# Patient Record
Sex: Female | Born: 1935 | Race: White | Hispanic: No | Smoking: Never smoker
Health system: Southern US, Community
[De-identification: ages and names within clinical notes are randomized; demographics above are authoritative.]

## PROBLEM LIST (undated history)

## (undated) DIAGNOSIS — N289 Disorder of kidney and ureter, unspecified: Secondary | ICD-10-CM

## (undated) DIAGNOSIS — T7840XA Allergy, unspecified, initial encounter: Secondary | ICD-10-CM

## (undated) DIAGNOSIS — I10 Essential (primary) hypertension: Secondary | ICD-10-CM

## (undated) DIAGNOSIS — M1990 Unspecified osteoarthritis, unspecified site: Secondary | ICD-10-CM

## (undated) DIAGNOSIS — D219 Benign neoplasm of connective and other soft tissue, unspecified: Secondary | ICD-10-CM

## (undated) HISTORY — DX: Benign neoplasm of connective and other soft tissue, unspecified: D21.9

## (undated) HISTORY — DX: Allergy, unspecified, initial encounter: T78.40XA

## (undated) HISTORY — DX: Unspecified osteoarthritis, unspecified site: M19.90

## (undated) HISTORY — DX: Disorder of kidney and ureter, unspecified: N28.9

## (undated) HISTORY — DX: Essential (primary) hypertension: I10

## (undated) HISTORY — PX: ABDOMINAL HYSTERECTOMY: SHX81

---

## 1966-02-21 HISTORY — PX: APPENDECTOMY: SHX54

## 2004-01-30 ENCOUNTER — Ambulatory Visit: Payer: Self-pay | Admitting: Internal Medicine

## 2004-03-16 ENCOUNTER — Ambulatory Visit: Payer: Self-pay | Admitting: Nephrology

## 2005-02-11 ENCOUNTER — Emergency Department: Payer: Self-pay | Admitting: Internal Medicine

## 2005-02-11 ENCOUNTER — Other Ambulatory Visit: Payer: Self-pay

## 2005-03-29 ENCOUNTER — Ambulatory Visit: Payer: Self-pay | Admitting: Internal Medicine

## 2006-04-25 ENCOUNTER — Ambulatory Visit: Payer: Self-pay | Admitting: Internal Medicine

## 2007-06-19 ENCOUNTER — Ambulatory Visit: Payer: Self-pay | Admitting: Internal Medicine

## 2008-02-25 ENCOUNTER — Ambulatory Visit: Payer: Self-pay | Admitting: Rheumatology

## 2008-06-10 ENCOUNTER — Ambulatory Visit: Payer: Self-pay | Admitting: Internal Medicine

## 2008-10-16 ENCOUNTER — Other Ambulatory Visit: Payer: Self-pay | Admitting: Internal Medicine

## 2008-12-03 ENCOUNTER — Other Ambulatory Visit: Payer: Self-pay | Admitting: Internal Medicine

## 2008-12-18 ENCOUNTER — Other Ambulatory Visit: Payer: Self-pay | Admitting: Internal Medicine

## 2009-02-21 HISTORY — PX: OTHER SURGICAL HISTORY: SHX169

## 2009-06-02 ENCOUNTER — Ambulatory Visit: Payer: Self-pay | Admitting: Ophthalmology

## 2009-08-05 ENCOUNTER — Ambulatory Visit: Payer: Self-pay | Admitting: Ophthalmology

## 2009-12-16 ENCOUNTER — Ambulatory Visit: Payer: Self-pay | Admitting: Internal Medicine

## 2010-01-18 ENCOUNTER — Ambulatory Visit: Payer: Self-pay | Admitting: Internal Medicine

## 2010-11-02 ENCOUNTER — Ambulatory Visit: Payer: Self-pay | Admitting: Internal Medicine

## 2011-08-05 ENCOUNTER — Ambulatory Visit: Payer: Self-pay | Admitting: Gastroenterology

## 2011-08-12 ENCOUNTER — Ambulatory Visit: Payer: Self-pay | Admitting: Gastroenterology

## 2011-10-31 ENCOUNTER — Emergency Department: Payer: Self-pay | Admitting: Emergency Medicine

## 2011-10-31 LAB — COMPREHENSIVE METABOLIC PANEL
Anion Gap: 9 (ref 7–16)
BUN: 31 mg/dL — ABNORMAL HIGH (ref 7–18)
Calcium, Total: 8.5 mg/dL (ref 8.5–10.1)
Chloride: 107 mmol/L (ref 98–107)
Co2: 23 mmol/L (ref 21–32)
Creatinine: 2.05 mg/dL — ABNORMAL HIGH (ref 0.60–1.30)
EGFR (African American): 24 — ABNORMAL LOW
EGFR (Non-African Amer.): 21 — ABNORMAL LOW
Osmolality: 286 (ref 275–301)
Potassium: 4.7 mmol/L (ref 3.5–5.1)
Sodium: 139 mmol/L (ref 136–145)

## 2011-10-31 LAB — CBC WITH DIFFERENTIAL/PLATELET
Basophil #: 0.1 10*3/uL (ref 0.0–0.1)
Eosinophil #: 0.1 10*3/uL (ref 0.0–0.7)
Lymphocyte #: 1.5 10*3/uL (ref 1.0–3.6)
MCH: 36.7 pg — ABNORMAL HIGH (ref 26.0–34.0)
MCHC: 34 g/dL (ref 32.0–36.0)
MCV: 108 fL — ABNORMAL HIGH (ref 80–100)
Monocyte #: 1.7 x10 3/mm — ABNORMAL HIGH (ref 0.2–0.9)
Monocyte %: 13.3 %
Neutrophil #: 9.5 10*3/uL — ABNORMAL HIGH (ref 1.4–6.5)
Platelet: 101 10*3/uL — ABNORMAL LOW (ref 150–440)
RBC: 2.98 10*6/uL — ABNORMAL LOW (ref 3.80–5.20)
RDW: 16 % — ABNORMAL HIGH (ref 11.5–14.5)
WBC: 12.9 10*3/uL — ABNORMAL HIGH (ref 3.6–11.0)

## 2011-10-31 LAB — URINALYSIS, COMPLETE
Blood: NEGATIVE
Nitrite: NEGATIVE
Ph: 5 (ref 4.5–8.0)
Protein: NEGATIVE

## 2011-10-31 LAB — LIPASE, BLOOD: Lipase: 76 U/L (ref 73–393)

## 2011-11-02 LAB — URINE CULTURE

## 2011-11-29 LAB — BASIC METABOLIC PANEL
BUN: 21 mg/dL (ref 4–21)
Creatinine: 1.6 mg/dL — AB (ref 0.5–1.1)
Glucose: 96 mg/dL
Potassium: 4.1 mmol/L (ref 3.4–5.3)
Sodium: 139 mmol/L (ref 137–147)

## 2011-11-29 LAB — HEPATIC FUNCTION PANEL
Alkaline Phosphatase: 98 U/L (ref 25–125)
Bilirubin, Total: 2.9 mg/dL

## 2011-11-29 LAB — PROTIME-INR: Protime: 14.4 seconds — AB (ref 10.0–13.8)

## 2011-11-29 LAB — CBC AND DIFFERENTIAL
HCT: 33 % — AB (ref 36–46)
WBC: 5.2 10^3/mL

## 2012-01-14 ENCOUNTER — Telehealth: Payer: Self-pay | Admitting: Internal Medicine

## 2012-01-14 MED ORDER — METOPROLOL SUCCINATE ER 50 MG PO TB24: 50.0000 mg | ORAL_TABLET | Freq: Every day | ORAL | Status: AC

## 2012-01-14 NOTE — Telephone Encounter (Signed)
rx sent in to [facility] for metoprolol #90 with one refil

## 2012-01-27 ENCOUNTER — Ambulatory Visit: Payer: Self-pay | Admitting: Gastroenterology

## 2012-02-07 ENCOUNTER — Telehealth: Payer: Self-pay | Admitting: Internal Medicine

## 2012-02-07 MED ORDER — FUROSEMIDE 20 MG PO TABS: ORAL_TABLET | ORAL | Status: AC

## 2012-02-07 NOTE — Telephone Encounter (Signed)
Sent in to pharmacy.  

## 2012-02-07 NOTE — Telephone Encounter (Signed)
Drug Name- Furosemide 20 mg tab  Directions- take one tablet three times per week  Quantity- 30

## 2012-03-21 ENCOUNTER — Encounter: Payer: Self-pay | Admitting: *Deleted

## 2012-03-23 ENCOUNTER — Encounter: Payer: Self-pay | Admitting: Internal Medicine

## 2012-03-23 ENCOUNTER — Ambulatory Visit (INDEPENDENT_AMBULATORY_CARE_PROVIDER_SITE_OTHER): Payer: Medicare Other | Admitting: Internal Medicine

## 2012-03-23 VITALS — BP 140/70 | HR 87 | Temp 97.9°F | Ht 64.5 in | Wt 153.8 lb

## 2012-03-23 DIAGNOSIS — N289 Disorder of kidney and ureter, unspecified: Secondary | ICD-10-CM

## 2012-03-23 DIAGNOSIS — E7800 Pure hypercholesterolemia, unspecified: Secondary | ICD-10-CM

## 2012-03-23 DIAGNOSIS — R739 Hyperglycemia, unspecified: Secondary | ICD-10-CM

## 2012-03-23 DIAGNOSIS — R109 Unspecified abdominal pain: Secondary | ICD-10-CM

## 2012-03-23 DIAGNOSIS — M109 Gout, unspecified: Secondary | ICD-10-CM

## 2012-03-23 DIAGNOSIS — R634 Abnormal weight loss: Secondary | ICD-10-CM

## 2012-03-23 DIAGNOSIS — R110 Nausea: Secondary | ICD-10-CM

## 2012-03-23 DIAGNOSIS — I10 Essential (primary) hypertension: Secondary | ICD-10-CM

## 2012-03-23 DIAGNOSIS — R7309 Other abnormal glucose: Secondary | ICD-10-CM

## 2012-03-23 DIAGNOSIS — R002 Palpitations: Secondary | ICD-10-CM

## 2012-03-23 DIAGNOSIS — M1990 Unspecified osteoarthritis, unspecified site: Secondary | ICD-10-CM

## 2012-03-23 LAB — BASIC METABOLIC PANEL
BUN: 26 mg/dL — ABNORMAL HIGH (ref 6–23)
Calcium: 8.9 mg/dL (ref 8.4–10.5)
Chloride: 104 mEq/L (ref 96–112)
Creatinine, Ser: 2.3 mg/dL — ABNORMAL HIGH (ref 0.4–1.2)

## 2012-03-23 LAB — LIPID PANEL
Cholesterol: 115 mg/dL (ref 0–200)
HDL: 36.6 mg/dL — ABNORMAL LOW (ref >39.00)
LDL Cholesterol: 59 mg/dL (ref 0–99)
Total CHOL/HDL Ratio: 3
Triglycerides: 98 mg/dL (ref 0.0–149.0)

## 2012-03-23 LAB — CBC WITH DIFFERENTIAL/PLATELET
Basophils Absolute: 0.1 10*3/uL (ref 0.0–0.1)
Eosinophils Relative: 6.6 % — ABNORMAL HIGH (ref 0.0–5.0)
HCT: 32.1 % — ABNORMAL LOW (ref 36.0–46.0)
Hemoglobin: 10.6 g/dL — ABNORMAL LOW (ref 12.0–15.0)
Lymphocytes Relative: 16.8 % (ref 12.0–46.0)
Monocytes Relative: 16.5 % — ABNORMAL HIGH (ref 3.0–12.0)
Platelets: 159 10*3/uL (ref 150.0–400.0)
RDW: 15.9 % — ABNORMAL HIGH (ref 11.5–14.6)
WBC: 6.7 10*3/uL (ref 4.5–10.5)

## 2012-03-23 LAB — HEPATIC FUNCTION PANEL
ALT: 30 U/L (ref 0–35)
Bilirubin, Direct: 1.3 mg/dL — ABNORMAL HIGH (ref 0.0–0.3)
Total Bilirubin: 4.3 mg/dL — ABNORMAL HIGH (ref 0.3–1.2)
Total Protein: 6.7 g/dL (ref 6.0–8.3)

## 2012-03-23 LAB — LIPASE: Lipase: 25 U/L (ref 11.0–59.0)

## 2012-03-23 LAB — AMYLASE: Amylase: 29 U/L (ref 27–131)

## 2012-03-24 ENCOUNTER — Encounter: Payer: Self-pay | Admitting: Internal Medicine

## 2012-03-25 ENCOUNTER — Encounter: Payer: Self-pay | Admitting: Internal Medicine

## 2012-03-25 DIAGNOSIS — N289 Disorder of kidney and ureter, unspecified: Secondary | ICD-10-CM | POA: Insufficient documentation

## 2012-03-25 DIAGNOSIS — M109 Gout, unspecified: Secondary | ICD-10-CM | POA: Insufficient documentation

## 2012-03-25 DIAGNOSIS — R002 Palpitations: Secondary | ICD-10-CM | POA: Insufficient documentation

## 2012-03-25 DIAGNOSIS — R739 Hyperglycemia, unspecified: Secondary | ICD-10-CM | POA: Insufficient documentation

## 2012-03-25 DIAGNOSIS — M1990 Unspecified osteoarthritis, unspecified site: Secondary | ICD-10-CM | POA: Insufficient documentation

## 2012-03-25 DIAGNOSIS — I10 Essential (primary) hypertension: Secondary | ICD-10-CM | POA: Insufficient documentation

## 2012-03-25 NOTE — Assessment & Plan Note (Signed)
Check metabolic panel and a1c.   

## 2012-03-25 NOTE — Assessment & Plan Note (Signed)
Saw Dr Rosita Kea.  S/p injection.  Ortho recommended a partial knee replacement.  She desired not to pursue.  Follow.

## 2012-03-25 NOTE — Assessment & Plan Note (Signed)
Blood pressure under reasonable control.  Same medication.  Check metabolic panel.      

## 2012-03-25 NOTE — Assessment & Plan Note (Signed)
Not an issue for her now.  Follow.  

## 2012-03-25 NOTE — Assessment & Plan Note (Signed)
Off allopurinol.  Stable.

## 2012-03-25 NOTE — Assessment & Plan Note (Signed)
Will recheck metabolic panel to confirm renal function stable.  Check electrolytes.

## 2012-03-25 NOTE — Progress Notes (Signed)
Subjective:    Patient ID: Lindsey Trevino, female    DOB: 17-Dec-1921, 79 y.o.   MRN: [id number]  HPI 79 year old female with past history of irregular heartbeat (previously worked up at [facility]), hypertension and renal insufficiency.  She comes in today accompanied by her granddaughter.  History obtained from both of them.  She states that in 9/13 she started having hallucinations and did not feel well.  Went to ER.  Diagnosed with UTI. Treated.  Has had no further hallucinations.  She has noticed recently - increased nausea.  Feels sick on her stomach.  Some intermittent emesis.  Mostly phlegm.  She reports decreased appetite.  Has lost a significant amount of weight.  Some constipation.  Last bowel movement - yesterday (small).  No blood.  No abdominal pain.  Had an abdominal ultrasound - ordered by GI.  Do not have results.  Saw GI.  Symptoms have worsened.  States she is trying to force herself to eat.  Yesterday, drank pineapple juice and developed stomach pain.  Emesis x 3.  No  Diarrhea.     Past Medical History  Diagnosis Date  . Hypertension   . Renal insufficiency   . Fibroid tumor     history  . Arthritis   . Allergy     Current Outpatient Prescriptions on File Prior to Visit  Medication Sig Dispense Refill  . furosemide (LASIX) 20 MG tablet Take one tablet three times a week  30 tablet  0  . metoprolol succinate (TOPROL-XL) 50 MG 24 hr tablet Take 1 tablet (50 mg total) by mouth daily. Take with or immediately following a meal.  90 tablet  1  . acetaminophen (TYLENOL) 650 MG CR tablet Take 650 mg by mouth every 8 (eight) hours as needed.      Marland Kitchen allopurinol (ZYLOPRIM) 100 MG tablet Take 100 mg by mouth daily.      . Multiple Vitamins-Minerals (RA VISION-VITE PRESERVE PO) Take 2 capsules by mouth daily.      . potassium chloride (K-DUR,KLOR-CON) 10 MEQ tablet Take 10 mEq by mouth daily. Take with Lasix      . triamcinolone cream (KENALOG) 0.1 % Apply 1 application topically 2 (two)  times daily.        Review of Systems Patient denies any headache, lightheadedness or dizziness.  No sinus or allergy symptoms.  No chest pain, tightness or palpitations.  No increased sob.  Increased fatigue.  Increased nausea and decreased appetite.  Some occasional emesis.  Mostly phlegm.   See above.  No abdominal pain.  Dry mouth.  Some constipation.  No blood.  Forcing herself to eat.   No urine change.  Weight loss.        Objective:   Physical Exam Filed Vitals:   03/23/12 0933  BP: 140/70  Pulse: 87  Temp: 97.9 F (36.6 C)   Blood pressure recheck:  140/72, pulse 50  79 year old female in no acute distress.   HEENT:  Nares- clear.  Oropharynx - without lesions. NECK:  Supple.  Nontender.  No audible bruit.  HEART:  Appears to be regular. LUNGS:  No crackles or wheezing audible.  Respirations even and unlabored.  RADIAL PULSE:  Equal bilaterally.  BREASTS:  No nipple discharge.  Could not appreciate any distinct nodules or axillary adenopathy. ABDOMEN:  Soft, nontender.  Bowel sounds present and normal.  No audible abdominal bruit.  RECTAL:  Heme negative.  No impaction.   EXTREMITIES:  No  increased edema present.  DP pulses palpable and equal bilaterally.           Assessment & Plan:  WEIGHT LOSS/NAUSEA/DECREASED APPETITE.  Will check cbc, liver panel, metabolic panel, amylase and lipase.  Obtain abdominal ultrasound results.  Stay hydrated.  Bland foods.  Further w/up pending review of the above.  May need CT (abdomen/pelvic).    HEALTH MAINTENANCE.  S/p hysterectomy.  See above.  Will need to keep on schedule with mammograms.

## 2012-03-26 ENCOUNTER — Telehealth: Payer: Self-pay | Admitting: Internal Medicine

## 2012-03-26 ENCOUNTER — Other Ambulatory Visit: Payer: Self-pay | Admitting: Internal Medicine

## 2012-03-26 DIAGNOSIS — R634 Abnormal weight loss: Secondary | ICD-10-CM

## 2012-03-26 DIAGNOSIS — R110 Nausea: Secondary | ICD-10-CM

## 2012-03-26 NOTE — Telephone Encounter (Signed)
Spoke with GI (Christianne) regarding her labs and further w/up.  Last bili they have 3.  Plan is to obtain another abdominal US - to reevaluate gall bladder as an etiology.  Also concern regarding primary biliary cirrhosis.  She declines bx.

## 2012-03-26 NOTE — Progress Notes (Signed)
Opened in error

## 2012-03-27 ENCOUNTER — Ambulatory Visit: Payer: Self-pay | Admitting: Internal Medicine

## 2012-03-28 ENCOUNTER — Telehealth: Payer: Self-pay | Admitting: *Deleted

## 2012-03-28 NOTE — Telephone Encounter (Signed)
Called GI.  They are going to work her in tomorrow.

## 2012-03-28 NOTE — Telephone Encounter (Signed)
Patient called back to tell you that when she called to get an appointment with Christiana Pellant they told her that they can not see her tomorrow but they can Friday morning. Patient wanted to know if it was all right to wait until then?

## 2012-04-02 ENCOUNTER — Inpatient Hospital Stay: Payer: Self-pay | Admitting: Internal Medicine

## 2012-04-02 LAB — URINALYSIS, COMPLETE
Bilirubin,UR: NEGATIVE
Hyaline Cast: 9
Ketone: NEGATIVE
Nitrite: NEGATIVE
Specific Gravity: 1.025 (ref 1.003–1.030)
WBC UR: 213 /HPF (ref 0–5)

## 2012-04-02 LAB — COMPREHENSIVE METABOLIC PANEL
Alkaline Phosphatase: 116 U/L (ref 50–136)
BUN: 33 mg/dL — ABNORMAL HIGH (ref 7–18)
Bilirubin,Total: 4.2 mg/dL — ABNORMAL HIGH (ref 0.2–1.0)
Calcium, Total: 9.6 mg/dL (ref 8.5–10.1)
Chloride: 109 mmol/L — ABNORMAL HIGH (ref 98–107)
Co2: 21 mmol/L (ref 21–32)
EGFR (Non-African Amer.): 15 — ABNORMAL LOW
Osmolality: 286 (ref 275–301)
SGOT(AST): 112 U/L — ABNORMAL HIGH (ref 15–37)
Sodium: 140 mmol/L (ref 136–145)
Total Protein: 7.6 g/dL (ref 6.4–8.2)

## 2012-04-02 LAB — MAGNESIUM: Magnesium: 2.2 mg/dL

## 2012-04-02 LAB — CBC
MCHC: 34 g/dL (ref 32.0–36.0)
RBC: 3.28 10*6/uL — ABNORMAL LOW (ref 3.80–5.20)
WBC: 7.8 10*3/uL (ref 3.6–11.0)

## 2012-04-03 ENCOUNTER — Telehealth: Payer: Self-pay | Admitting: Internal Medicine

## 2012-04-03 LAB — BASIC METABOLIC PANEL
Anion Gap: 10 (ref 7–16)
Anion Gap: 9 (ref 7–16)
BUN: 33 mg/dL — ABNORMAL HIGH (ref 7–18)
BUN: 32 mg/dL — ABNORMAL HIGH (ref 7–18)
Calcium, Total: 9.4 mg/dL (ref 8.5–10.1)
Co2: 23 mmol/L (ref 21–32)
Co2: 24 mmol/L (ref 21–32)
Creatinine: 3.07 mg/dL — ABNORMAL HIGH (ref 0.60–1.30)
Creatinine: 2.92 mg/dL — ABNORMAL HIGH (ref 0.60–1.30)
EGFR (African American): 15 — ABNORMAL LOW
EGFR (African American): 16 — ABNORMAL LOW
Potassium: 4.1 mmol/L (ref 3.5–5.1)

## 2012-04-03 LAB — AMMONIA: Ammonia, Plasma: 76 mcmol/L — ABNORMAL HIGH (ref 11–32)

## 2012-04-03 NOTE — Telephone Encounter (Signed)
Nurse was calling to let you know that she was put in the hospital at [facility].

## 2012-04-04 LAB — CBC WITH DIFFERENTIAL/PLATELET
Basophil #: 0.1 10*3/uL (ref 0.0–0.1)
Basophil %: 1.1 %
Eosinophil #: 0.6 10*3/uL (ref 0.0–0.7)
HCT: 29.6 % — ABNORMAL LOW (ref 35.0–47.0)
HGB: 9.9 g/dL — ABNORMAL LOW (ref 12.0–16.0)
Lymphocyte #: 1.6 10*3/uL (ref 1.0–3.6)
MCV: 104 fL — ABNORMAL HIGH (ref 80–100)
Monocyte %: 16.7 %
Neutrophil %: 50.9 %
Platelet: 117 10*3/uL — ABNORMAL LOW (ref 150–440)
RBC: 2.84 10*6/uL — ABNORMAL LOW (ref 3.80–5.20)
WBC: 7.2 10*3/uL (ref 3.6–11.0)

## 2012-04-04 LAB — BASIC METABOLIC PANEL
Anion Gap: 9 (ref 7–16)
BUN: 33 mg/dL — ABNORMAL HIGH (ref 7–18)
Calcium, Total: 8.8 mg/dL (ref 8.5–10.1)
Co2: 23 mmol/L (ref 21–32)
Creatinine: 2.92 mg/dL — ABNORMAL HIGH (ref 0.60–1.30)
EGFR (African American): 16 — ABNORMAL LOW
EGFR (Non-African Amer.): 14 — ABNORMAL LOW
Osmolality: 297 (ref 275–301)
Potassium: 3.8 mmol/L (ref 3.5–5.1)

## 2012-04-04 LAB — URINE CULTURE

## 2012-04-05 LAB — SODIUM: Sodium: 144 mmol/L (ref 136–145)

## 2012-04-05 LAB — CBC WITH DIFFERENTIAL/PLATELET
Basophil #: 0.1 x10 3/mm 3 (ref 0.0–0.1)
Basophil %: 1 %
Eosinophil #: 0.6 x10 3/mm 3 (ref 0.0–0.7)
Eosinophil %: 8.8 %
HCT: 28.2 % — ABNORMAL LOW (ref 35.0–47.0)
HGB: 9.5 g/dL — ABNORMAL LOW (ref 12.0–16.0)
Lymphocyte %: 25.7 %
Lymphs Abs: 1.7 x10 3/mm 3 (ref 1.0–3.6)
MCH: 35 pg — ABNORMAL HIGH (ref 26.0–34.0)
MCHC: 33.6 g/dL (ref 32.0–36.0)
MCV: 104 fL — ABNORMAL HIGH (ref 80–100)
Monocyte #: 1.2 x10 3/mm — ABNORMAL HIGH (ref 0.2–0.9)
Monocyte %: 18.3 %
Neutrophil #: 3 x10 3/mm 3 (ref 1.4–6.5)
Neutrophil %: 46.2 %
Platelet: 106 x10 3/mm 3 — ABNORMAL LOW (ref 150–440)
RBC: 2.72 X10 6/mm 3 — ABNORMAL LOW (ref 3.80–5.20)
RDW: 15.4 % — ABNORMAL HIGH (ref 11.5–14.5)
WBC: 6.4 x10 3/mm 3 (ref 3.6–11.0)

## 2012-04-05 LAB — COMPREHENSIVE METABOLIC PANEL
Alkaline Phosphatase: 90 U/L (ref 50–136)
Anion Gap: 9 (ref 7–16)
BUN: 32 mg/dL — ABNORMAL HIGH (ref 7–18)
Bilirubin,Total: 3 mg/dL — ABNORMAL HIGH (ref 0.2–1.0)
Calcium, Total: 9 mg/dL (ref 8.5–10.1)
Chloride: 116 mmol/L — ABNORMAL HIGH (ref 98–107)
Co2: 23 mmol/L (ref 21–32)
EGFR (African American): 17 — ABNORMAL LOW
EGFR (Non-African Amer.): 15 — ABNORMAL LOW
Glucose: 89 mg/dL (ref 65–99)
Osmolality: 301 (ref 275–301)
Potassium: 3.9 mmol/L (ref 3.5–5.1)
SGOT(AST): 73 U/L — ABNORMAL HIGH (ref 15–37)
SGPT (ALT): 28 U/L (ref 12–78)
Sodium: 148 mmol/L — ABNORMAL HIGH (ref 136–145)

## 2012-04-11 ENCOUNTER — Telehealth: Payer: Self-pay | Admitting: Internal Medicine

## 2012-04-11 NOTE — Telephone Encounter (Signed)
Pt discharged from [facility] last Thursday and [facility] opened her to services over the weekend (Saturday 02/15) due to our office being closed due to weather.  [facility] needs to be sure that Dr. Lorin Picket will sign orders for them regarding nursing, PT and an aide.   May contact Deborah at [facility] with any questions.  They just need a verbal okay.

## 2012-04-11 NOTE — Telephone Encounter (Signed)
I found the forms that were faxed last week. Called [facility], they said they did not receive anything. Gave verbal.

## 2012-04-11 NOTE — Telephone Encounter (Signed)
Ok.  I have already signed something for them.  It should have been faxed.

## 2012-04-13 ENCOUNTER — Ambulatory Visit (INDEPENDENT_AMBULATORY_CARE_PROVIDER_SITE_OTHER): Payer: Medicare Other | Admitting: Internal Medicine

## 2012-04-13 ENCOUNTER — Encounter: Payer: Self-pay | Admitting: Internal Medicine

## 2012-04-13 VITALS — BP 130/58 | HR 64 | Temp 97.8°F | Ht 64.5 in | Wt 208.5 lb

## 2012-04-13 DIAGNOSIS — R112 Nausea with vomiting, unspecified: Secondary | ICD-10-CM

## 2012-04-13 DIAGNOSIS — R002 Palpitations: Secondary | ICD-10-CM

## 2012-04-13 DIAGNOSIS — K7460 Unspecified cirrhosis of liver: Secondary | ICD-10-CM

## 2012-04-13 DIAGNOSIS — I10 Essential (primary) hypertension: Secondary | ICD-10-CM

## 2012-04-13 DIAGNOSIS — N289 Disorder of kidney and ureter, unspecified: Secondary | ICD-10-CM

## 2012-04-15 ENCOUNTER — Encounter: Payer: Self-pay | Admitting: Internal Medicine

## 2012-04-15 DIAGNOSIS — R112 Nausea with vomiting, unspecified: Secondary | ICD-10-CM | POA: Insufficient documentation

## 2012-04-15 DIAGNOSIS — K7460 Unspecified cirrhosis of liver: Secondary | ICD-10-CM | POA: Insufficient documentation

## 2012-04-15 NOTE — Assessment & Plan Note (Signed)
See above.  Has zofran.  Probably multifactorial.  Try to encourage increased po intake.

## 2012-04-15 NOTE — Assessment & Plan Note (Signed)
Not an issue for her now.  Follow.  

## 2012-04-15 NOTE — Assessment & Plan Note (Signed)
Will need to discuss with nephrology regarding diuretic therapy - given her renal insufficiency and poor po intake.  Follow.

## 2012-04-15 NOTE — Assessment & Plan Note (Signed)
Blood pressure under reasonable control.  Follow.   

## 2012-04-15 NOTE — Assessment & Plan Note (Signed)
Unclear as to the exact etiology.  Possibly PBC.  Pt declines diagnostic testing.  With elevated ammonia.  Trying to take at least a small amount of lactulose.  Hard for her to tolerate.  Try to encourage increased po intake.  Increased edema and fluid retention.  Will need nephrology's input regarding lasix usage - given her worsening renal function and poor po intake.  Continues to follow up with GI.

## 2012-04-15 NOTE — Progress Notes (Signed)
Subjective:    Patient ID: Lindsey Trevino, female    DOB: 1921-09-20, 79 y.o.   MRN: [id number]  HPI 79 year old female with past history of irregular heartbeat (previously worked up at [facility]), hypertension, cirrhosis and renal insufficiency.  She comes in today accompanied by her granddaughter and her son.   History obtained from all of them.  She was recently hospitalized (04/02/12) for pyelonephritis.  Was found to have an elevated ammonia level.  Given lactulose in the hospital.  Was also given IV lasix.  Treated with abx.  Still on cipro.  Still with decreased appetite.  Increased nausea.  Eats very little.  Increased edema.  Was noted to have ascites on exam.  Has trouble tolerating the lactulose.  Breathing stable.  Weak.       Past Medical History  Diagnosis Date  . Hypertension   . Renal insufficiency   . Fibroid tumor     history  . Arthritis   . Allergy       Current Outpatient Prescriptions on File Prior to Visit  Medication Sig Dispense Refill  . acetaminophen (TYLENOL) 650 MG CR tablet Take 650 mg by mouth every 8 (eight) hours as needed.      . metoprolol succinate (TOPROL-XL) 50 MG 24 hr tablet Take 1 tablet (50 mg total) by mouth daily. Take with or immediately following a meal.  90 tablet  1  . allopurinol (ZYLOPRIM) 100 MG tablet Take 100 mg by mouth daily.      . furosemide (LASIX) 20 MG tablet Take one tablet three times a week  30 tablet  0   No current facility-administered medications on file prior to visit.    Review of Systems Patient denies any headache or significant lightheadedness or dizziness.  No sinus or allergy symptoms.  No chest pain, tightness or palpitations.  No increased sob.  Increased fatigue.  Increased nausea and decreased appetite.  Some occasional emesis. See above.  Forcing herself to eat.   No urine change now.  On cipro.  Trying to take at least a small amount of lactulose daily.  Increased edema.  Has a laceration left leg.  No increased  redness.        Objective:   Physical Exam  Filed Vitals:   04/13/12 1034  BP: 130/58  Pulse: 64  Temp: 97.8 F (39.76 C)   78 year old female in no acute distress.   HEENT:  Nares- clear.  Oropharynx - without lesions. NECK:  Supple.  Nontender.  No audible bruit.  HEART:  Appears to be regular. LUNGS:  No crackles or wheezing audible.  Respirations even and unlabored.  RADIAL PULSE:  Equal bilaterally.  ABDOMEN:  Soft, nontender.  Bowel sounds present and normal.  No audible abdominal bruit.  EXTREMITIES:  Increased edema.  Pitting.  Laceration left lower extremity.  No surrounding erythema.          Assessment & Plan:  WEIGHT LOSS/NAUSEA/DECREASED APPETITE.  Persistent.  Has cirrhosis - possibly PBC.  Declines further diagnostic testing.  Ammonia level elevated.  Attempting to take lactulose.  Has problems tolerating.  Encourage increased po intake.  Try to avoid protein deficiency.   Also has renal insufficiency.  Using Zofran without significant relief.  Discussed with her and her family at length - regarding the above situation.  Again she desires no further diagnostic testing.  Request no heroic measures.  Discussed possible hospice with the son.  Will need evaluation from nephrology  regarding diuretics with her ongoing renal insufficiency and poor po intake.     HEALTH MAINTENANCE.  S/p hysterectomy.

## 2012-04-25 ENCOUNTER — Encounter: Payer: Self-pay | Admitting: Internal Medicine

## 2012-04-26 ENCOUNTER — Telehealth: Payer: Self-pay | Admitting: *Deleted

## 2012-04-26 NOTE — Telephone Encounter (Signed)
Called and spoke to pts son Ortencia Kick.  Questions answered.

## 2012-04-26 NOTE — Telephone Encounter (Signed)
Dr. Lorin Picket the patients son called stating that you invited he and his brother to a "Q&A" phone session regarding there mother. His name and number are: Lindsey [phone]  [name]

## 2012-04-27 NOTE — Telephone Encounter (Signed)
Spoke to Lindsey Trevino.  Will hold on lower extremity ultrasound.  Family will notify me if/when desire hospice referral.  Continue with [facility].

## 2012-04-30 ENCOUNTER — Telehealth: Payer: Self-pay | Admitting: Internal Medicine

## 2012-04-30 NOTE — Telephone Encounter (Signed)
Caller: [facility]; Phone: [phone]; Reason for Call: Stanton Kidney from [facility] requesting the last office visit notes with the terminal hospice diagnosis code, since pt is transitioning to [facility].  Fax # [phone].  PLEASE F/U WITH DEBRA IF QUESTIONS.  THANK YOU.

## 2012-04-30 NOTE — Telephone Encounter (Signed)
Faxed last note over

## 2012-05-02 ENCOUNTER — Telehealth: Payer: Self-pay | Admitting: *Deleted

## 2012-05-02 MED ORDER — ACETAMINOPHEN 325 MG RE SUPP: RECTAL | Status: AC

## 2012-05-02 MED ORDER — FENTANYL 12 MCG/HR TD PT72: 1.0000 | MEDICATED_PATCH | TRANSDERMAL | Status: AC

## 2012-05-02 MED ORDER — PROMETHAZINE HCL 25 MG RE SUPP: 25.0000 mg | Freq: Four times a day (QID) | RECTAL | Status: AC | PRN

## 2012-05-02 NOTE — Telephone Encounter (Signed)
rx printed for fentanyl, phenergan and tylenol supp.  Please fax to [facility].

## 2012-05-02 NOTE — Telephone Encounter (Signed)
Prescription faxed to pharmacy.

## 2012-05-02 NOTE — Telephone Encounter (Signed)
Hospice called stating that patient is throwing up and having severe abdominal pain. Hospice wanted to know it you would give patient acetaminophen, fentanyl 12.5 patches because patient can not swallow. [facility] pharmacy. They ask asap. Lynden Ang)

## 2012-05-16 ENCOUNTER — Telehealth: Payer: Self-pay | Admitting: General Practice

## 2012-05-16 MED ORDER — MORPHINE SULFATE (CONCENTRATE) 20 MG/ML PO SOLN: ORAL | Status: DC

## 2012-05-16 MED ORDER — MORPHINE SULFATE (CONCENTRATE) 20 MG/ML PO SOLN: ORAL | Status: AC

## 2012-05-16 NOTE — Telephone Encounter (Signed)
Order printed for morphine sulfate.  (hospice pt).  Please fax to [facility]

## 2012-05-16 NOTE — Telephone Encounter (Signed)
Morphine Sulfate 20mg /mL. Sig need to read 0.25-0.56mL every hour as needed for pain, SOB, and restlessness. Needs to be sent to [facility] pharmacy. Please write Hopsice pt in the body of the Rx.

## 2012-05-16 NOTE — Telephone Encounter (Signed)
Script faxed to pharmacy

## 2012-05-21 ENCOUNTER — Encounter: Payer: Self-pay | Admitting: Internal Medicine

## 2012-05-22 DEATH — deceased

## 2013-01-16 IMAGING — US ABDOMEN ULTRASOUND
1 series · 13 of 25 positions shown · non-contrast
Comparison: none

REASON FOR EXAM: RUQ LUQ pain complete
COMMENTS:

PROCEDURE:     US  - US ABDOMEN GENERAL SURVEY  - August 05, 2011 [DATE]
RESULT:

[Series 1: abdomen ultrasound · 0.31mm/px · 13 of 70 slices shown]
[im 1/70]
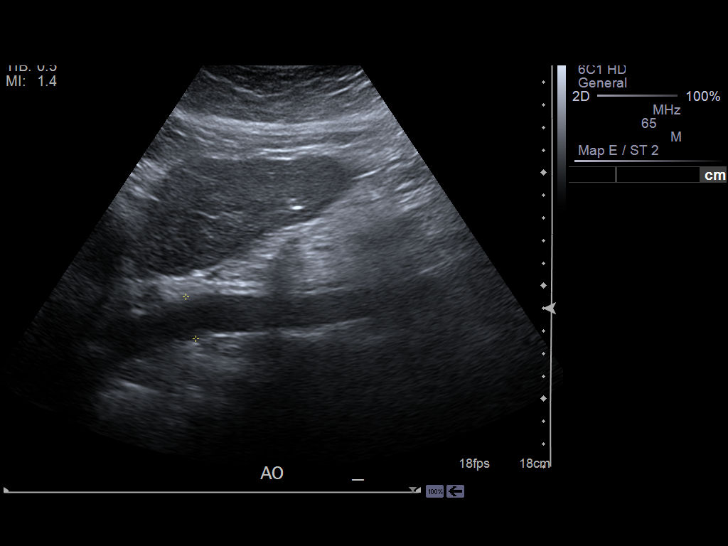
[im 6/70]
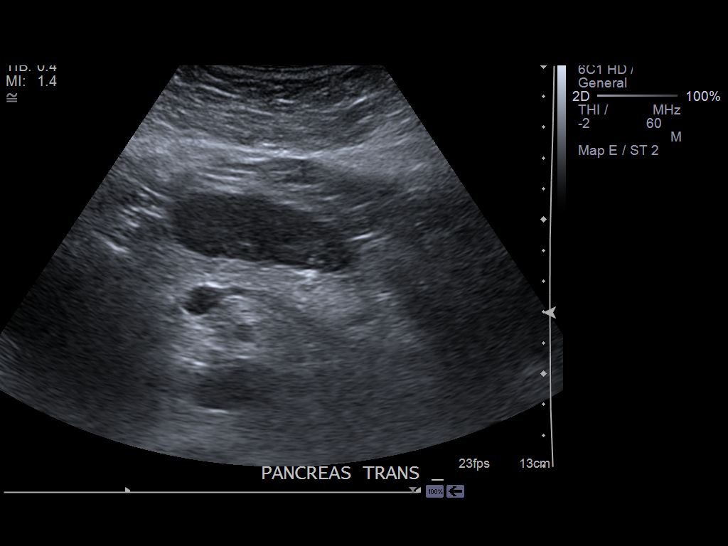
[im 12/70]
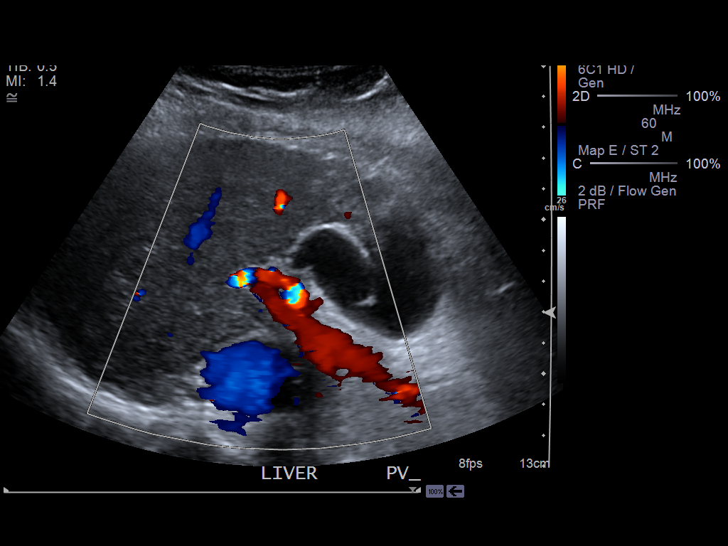
[im 18/70]
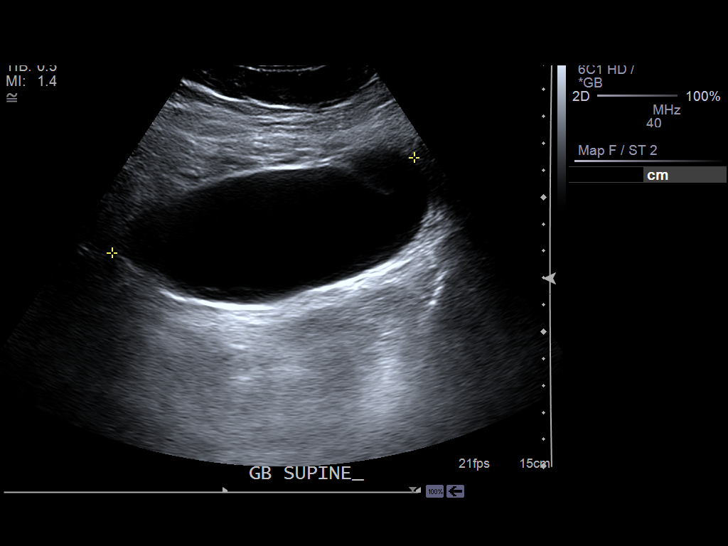
[im 24/70]
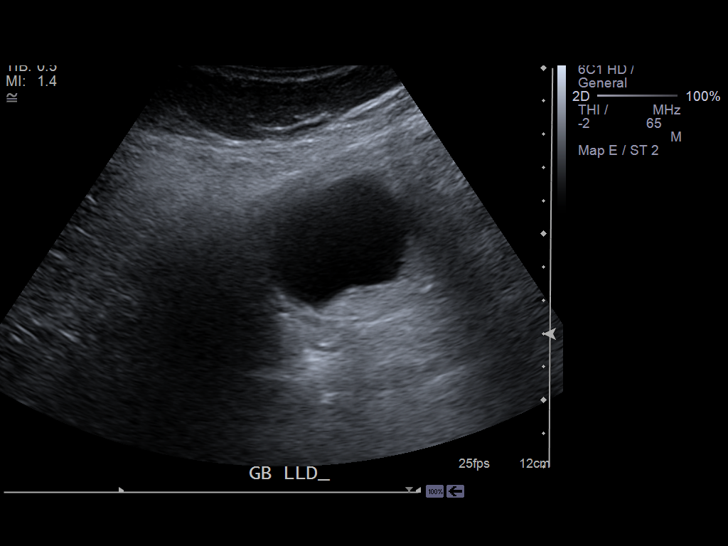
[im 29/70]
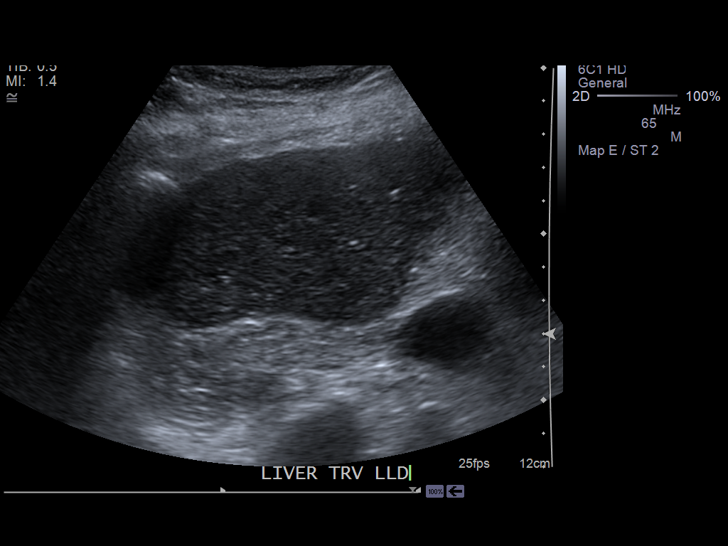
[im 35/70]
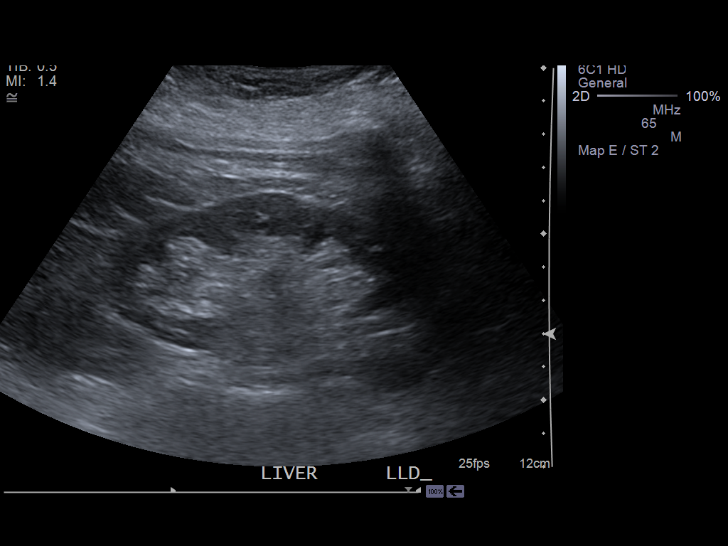
[im 41/70]
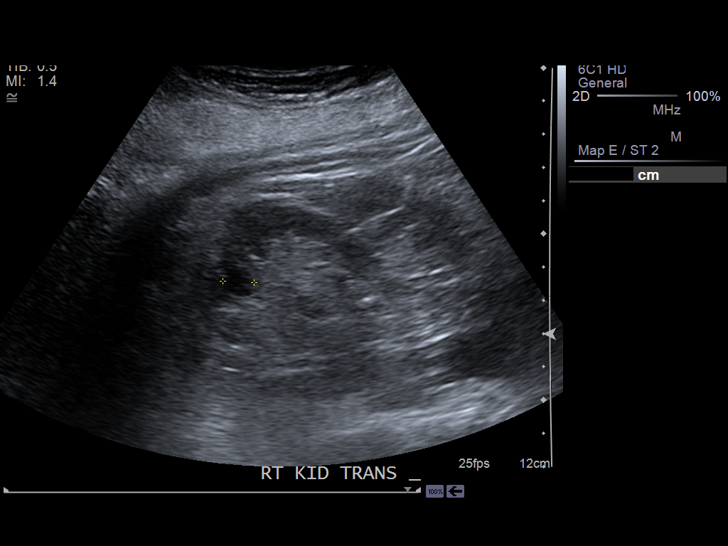
[im 47/70]
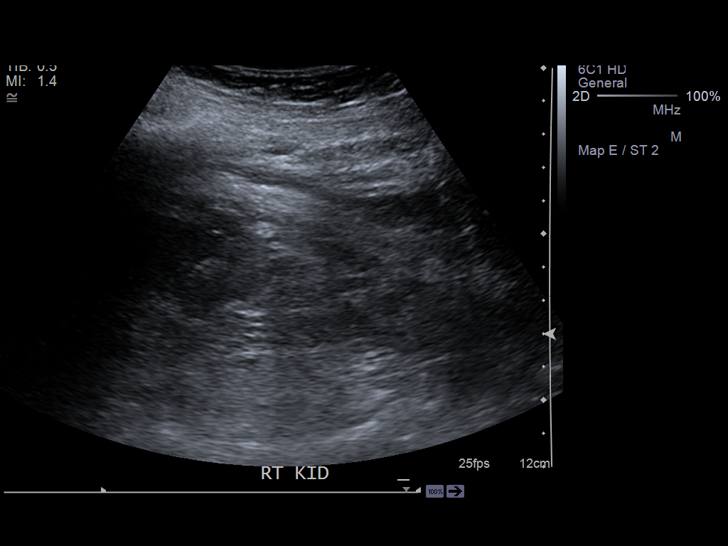
[im 52/70]
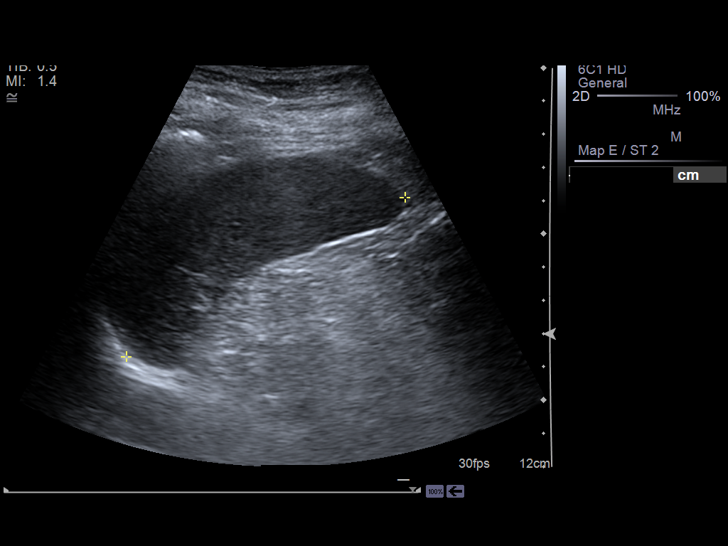
[im 58/70]
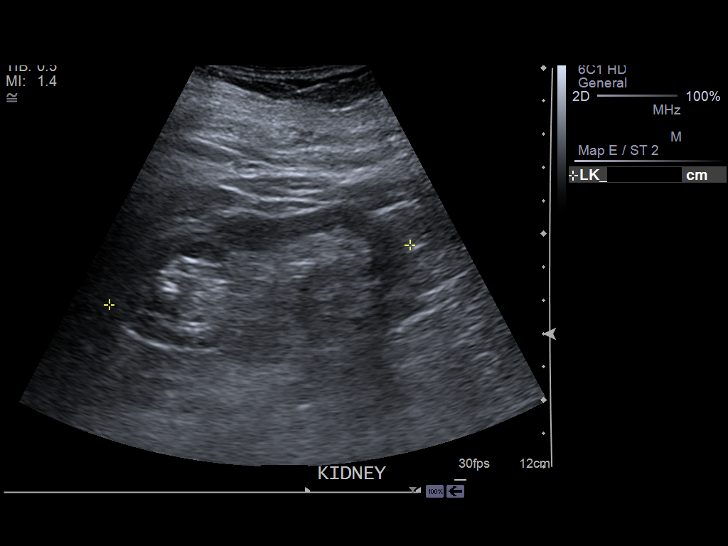
[im 64/70]
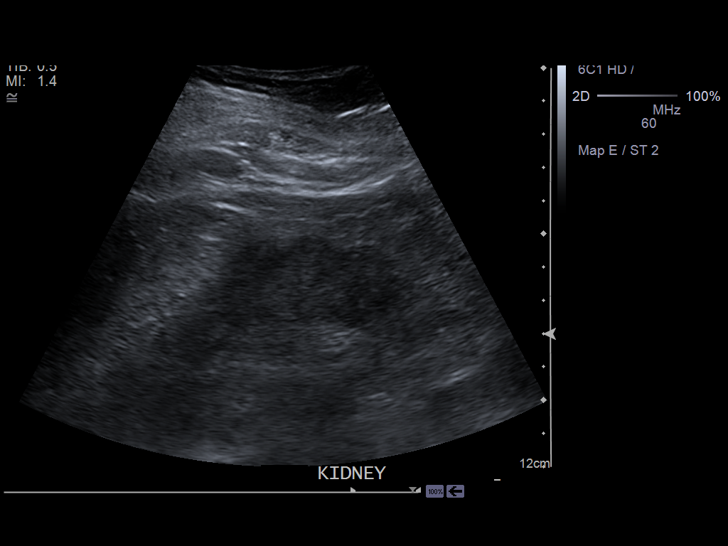
[im 70/70]
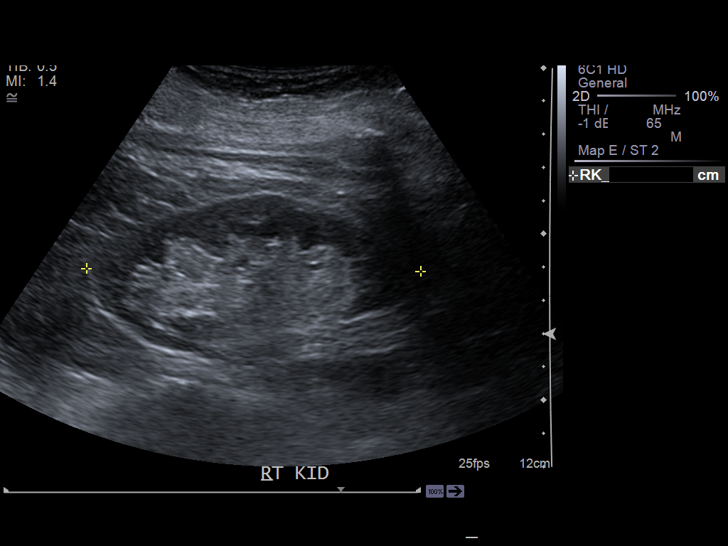

[13 of 25 positions shown; findings below may reference images not displayed]

FINDINGS: This study was compared to a previous study dated 11/02/2010.
FINDINGS: The liver is atrophic demonstrating coarse echotexture and nodular
border likely representing changes of cirrhosis. This finding appears stable
when compared to previous study. A small amount of ascites is identified.
The aorta and IVC are unremarkable. The visualized portions of the pancreas
are unremarkable. The spleen measures 9.6 cm in longitudinal dimensions and
demonstrates a homogeneous echotexture. The gallbladder measures 5.46 x 5 x
11.7 cm. These findings are consistent with hydropic gallbladder which when
compared to the previous study is slightly increased in size. Sludge is
identified within the dependent portion of the gallbladder. There is no
evidence of a sonographic Murphy sign, gallbladder wall thickening or common
bile duct dilatation. Gallbladder wall measures 1.5 mm in diameter and the
common bile duct 5 mm. The right kidney measures 10 x 5 x 4 cm and the left
9.2 x 3.4 x 4.3 cm. A stable small cyst is identified within the right
kidney as well as a stable small rounded echogenic focus. Bilateral cortical
thinning is identified. There is no evidence of hydronephrosis, masses or
calculi within the left kidney.
IMPRESSION: 1. Small amount of ascites.
2. Findings likely reflecting cirrhotic changes within the liver.
3. Stable findings within the kidneys.
4. Findings likely reflecting hydropic gallbladder which also appears stable
to slightly increased.

## 2013-07-10 IMAGING — US ABDOMEN ULTRASOUND
1 series · 14 of 25 positions shown · non-contrast
Comparison: none

REASON FOR EXAM: Elevated liver funtion
COMMENTS:

PROCEDURE:     US  - US ABDOMEN GENERAL SURVEY  - January 27, 2012 [DATE]
RESULT:     Comparison: None
TECHNIQUE: Multiple gray-scale and color-flow Doppler images of the abdomen
are presented for review.

[Series 1: abdomen ultrasound · 0.31mm/px · 14 of 85 slices shown]
[im 1/85]
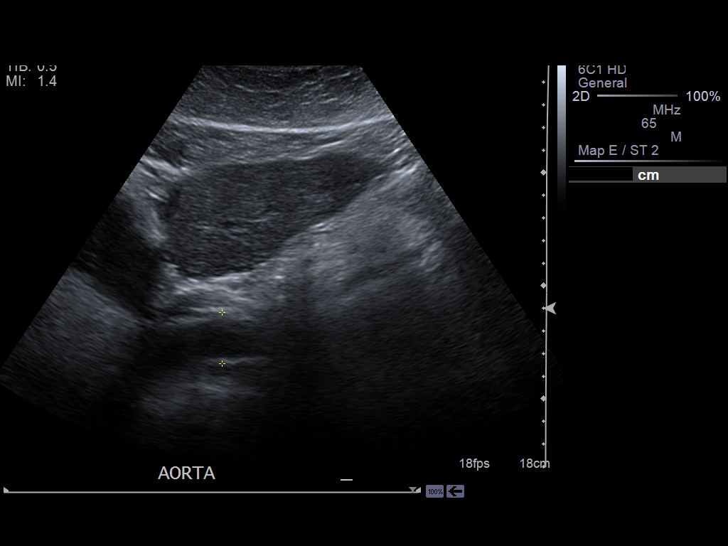
[im 8/85]
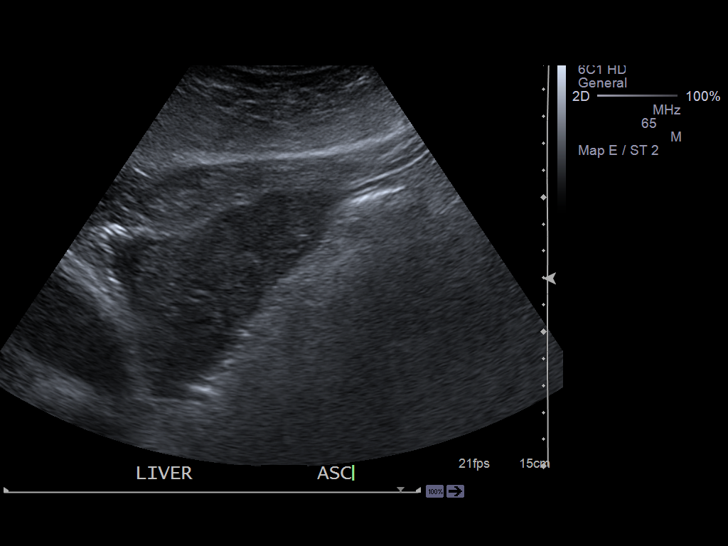
[im 15/85]
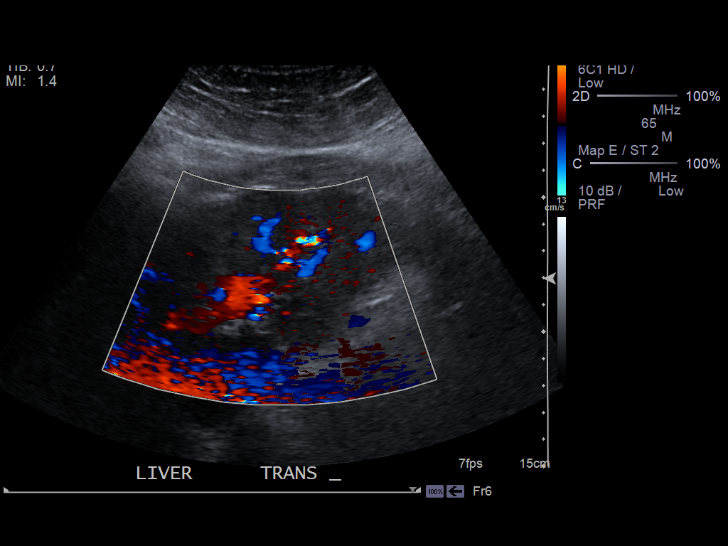
[im 22/85]
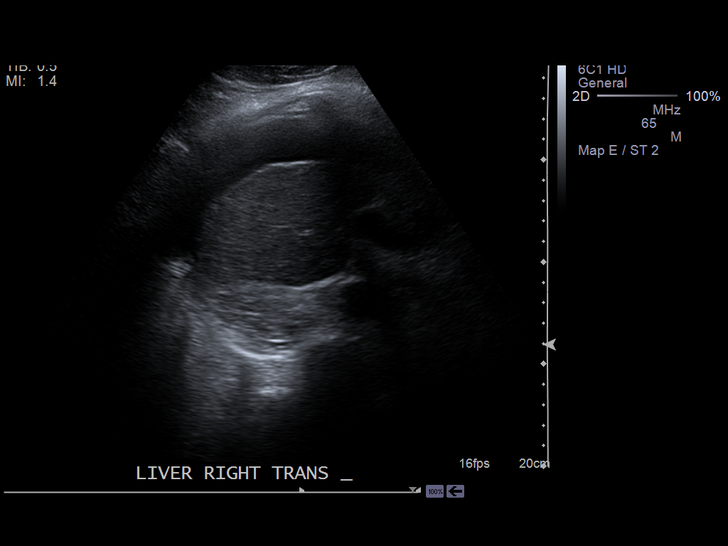
[im 29/85]
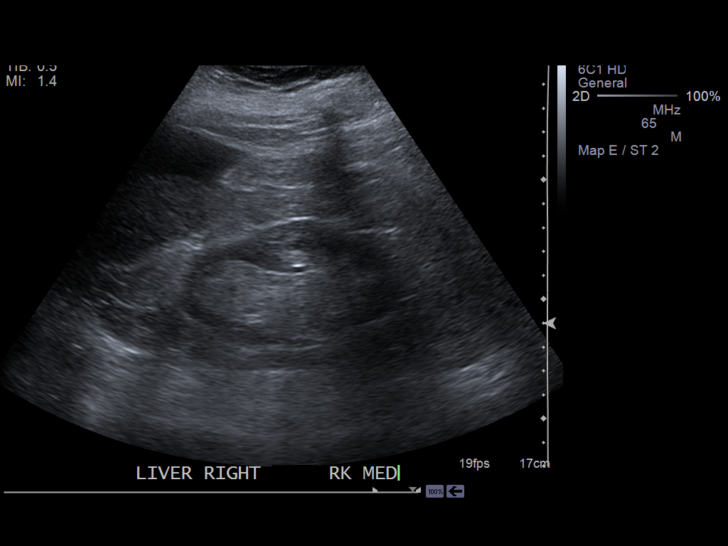
[im 32/85]
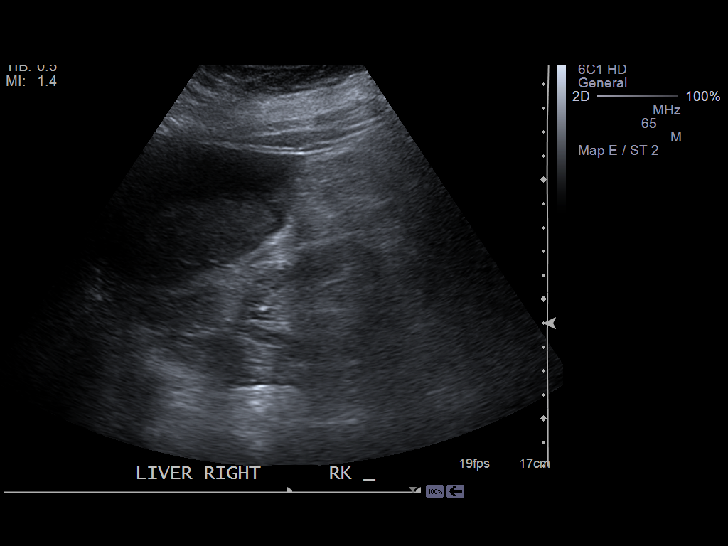
[im 39/85]
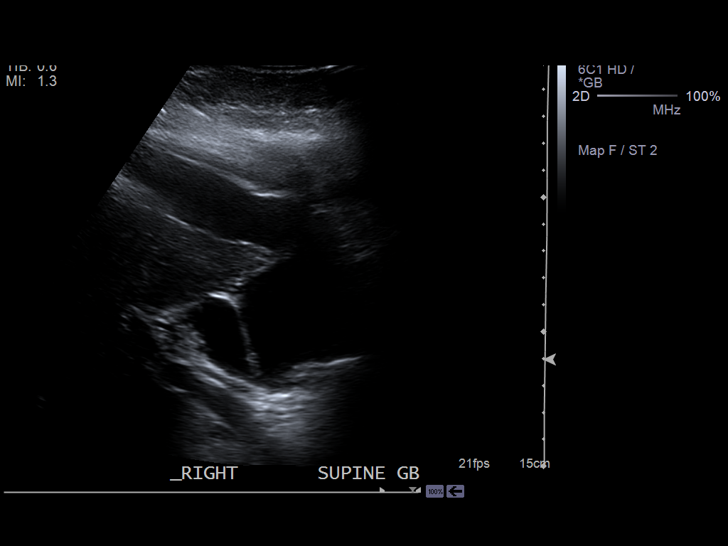
[im 46/85]
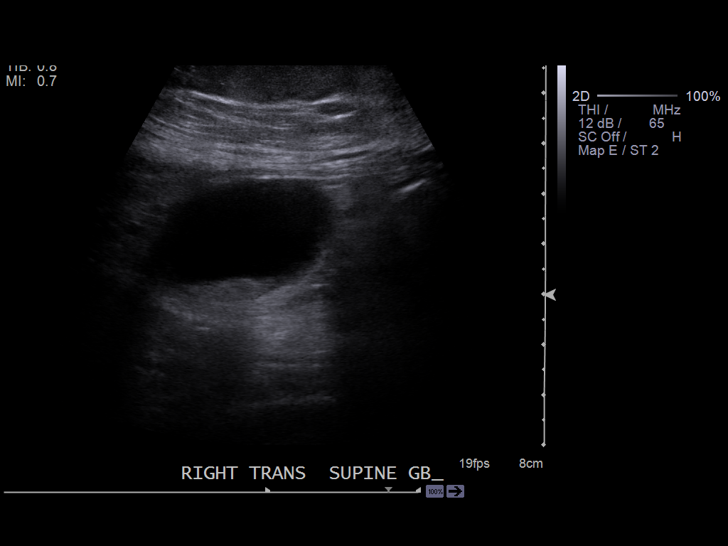
[im 53/85]
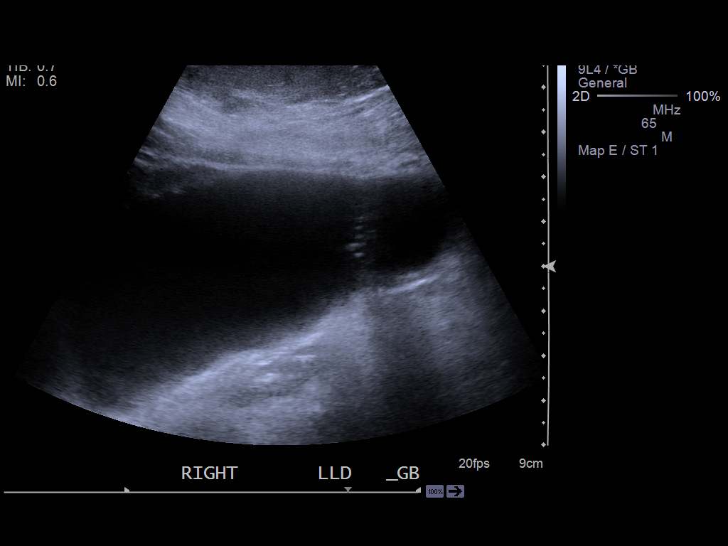
[im 57/85]
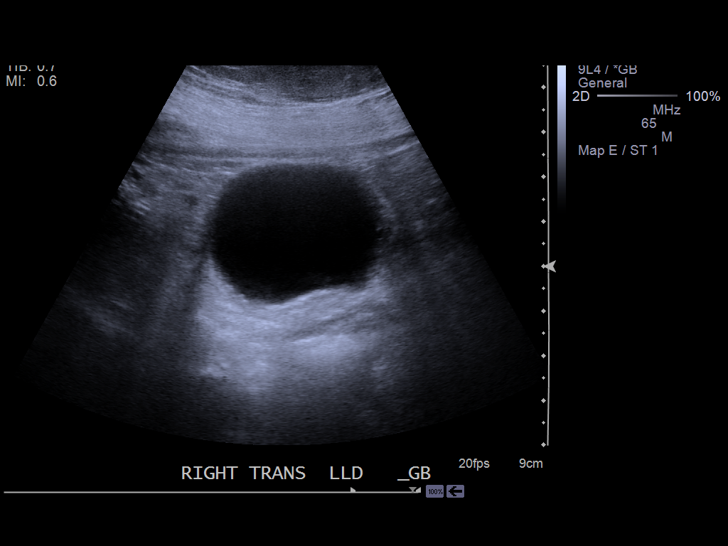
[im 64/85]
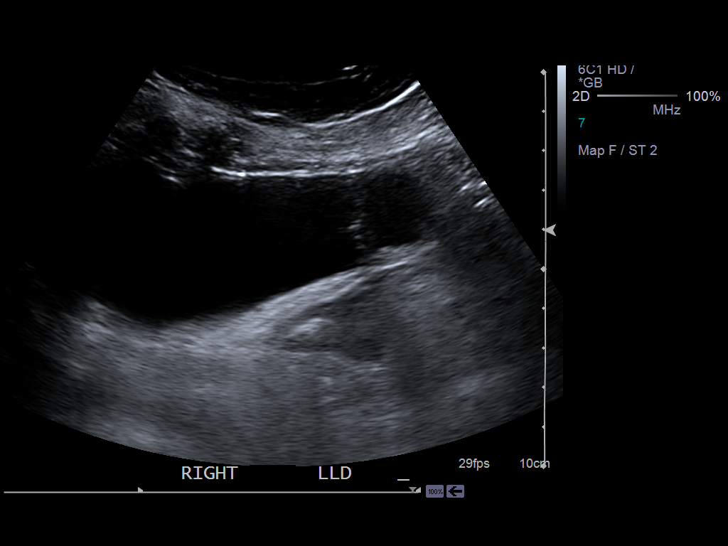
[im 71/85]
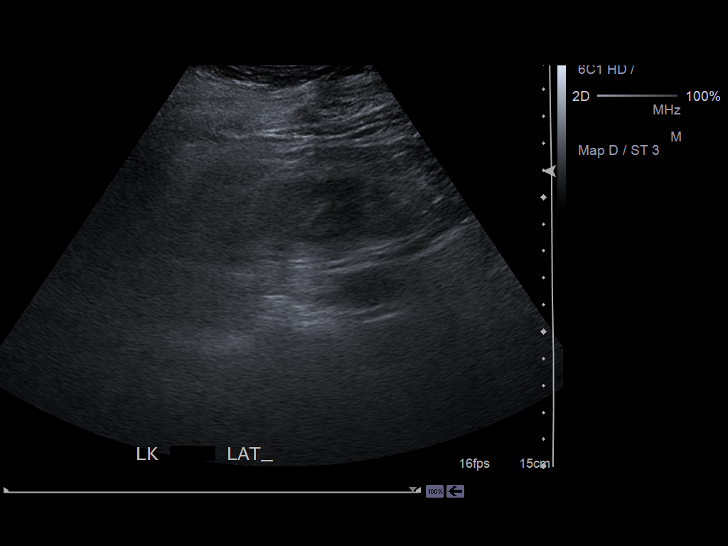
[im 78/85]
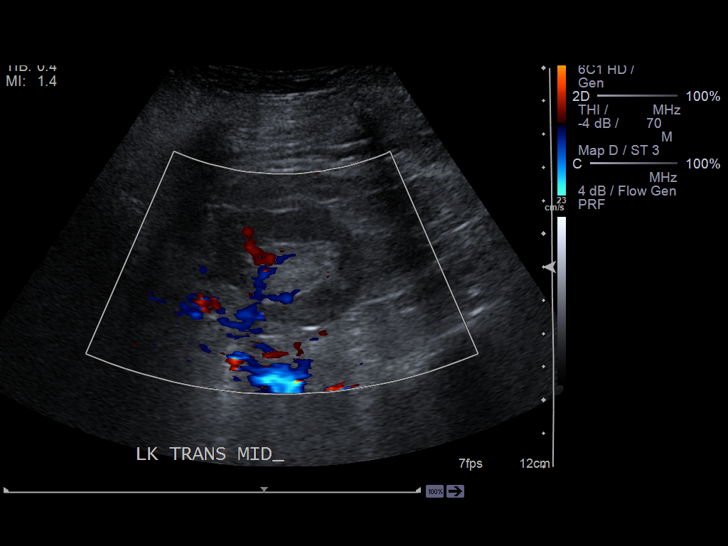
[im 85/85]
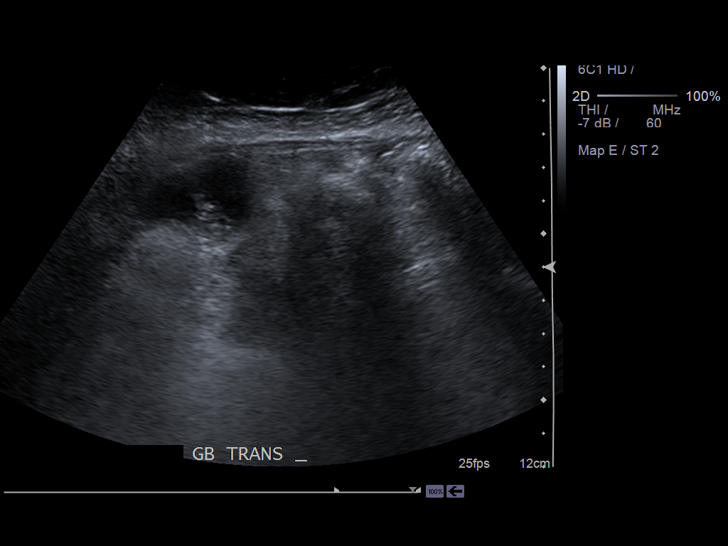

[14 of 25 positions shown; findings below may reference images not displayed]

FINDINGS: Visualized portions of the liver demonstrate normal echogenicity and normal
contours. The liver is without evidence of a focal hepatic lesion.

There are cholelithiasis. There is no intra- or extrahepatic biliary ductal
dilatation. The common duct measures 2.9 mm in maximal diameter. There is no
gallbladder wall thickening, pericholecystic fluid, or sonographic Murphy's
sign.

The visualized portion of the pancreas is normal in echogenicity. The spleen
is unremarkable. Bilateral kidneys are normal in echogenicity and size. The
right kidney measures 10 x 4.8 x 4.2 cm. The left kidney measures 9.5 x
x 3 cm. There are no renal calculi or hydronephrosis. The abdominal aorta
and IVC are unremarkable.
IMPRESSION: 1. Cholelithiasis without sonographic evidence of acute cholecystitis.

[REDACTED]

## 2013-09-08 IMAGING — US ABDOMEN ULTRASOUND
1 series · 13 of 25 positions shown · non-contrast
Comparison: none

REASON FOR EXAM: Abn Liver Functions
COMMENTS:

[Series 1: abdomen ultrasound · 0.31mm/px · 13 of 76 slices shown]
[im 1/76]
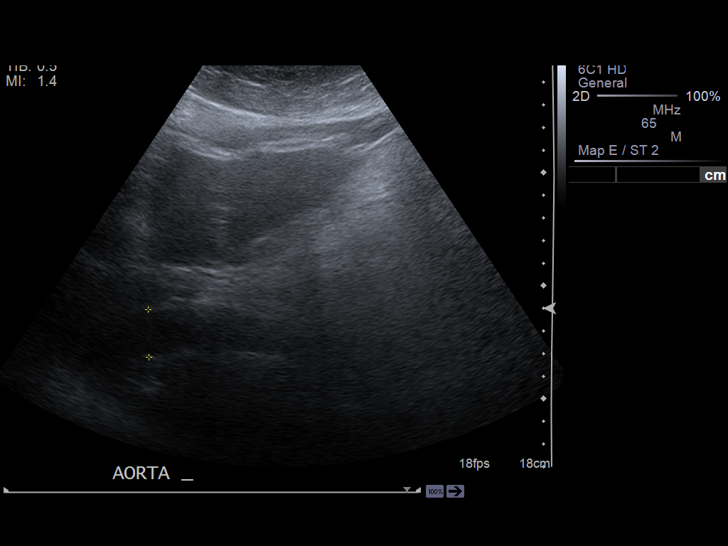
[im 7/76]
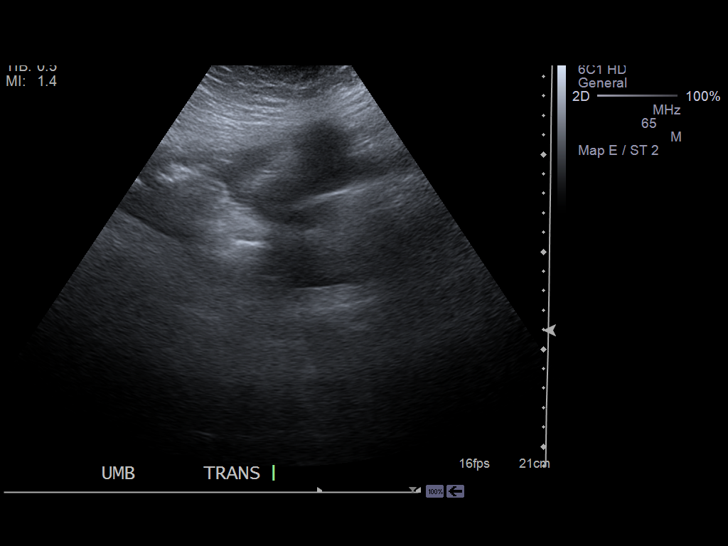
[im 13/76]
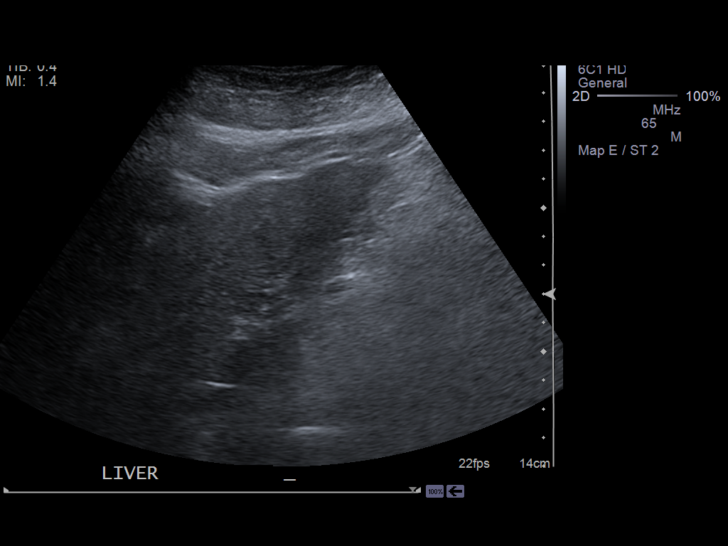
[im 19/76]
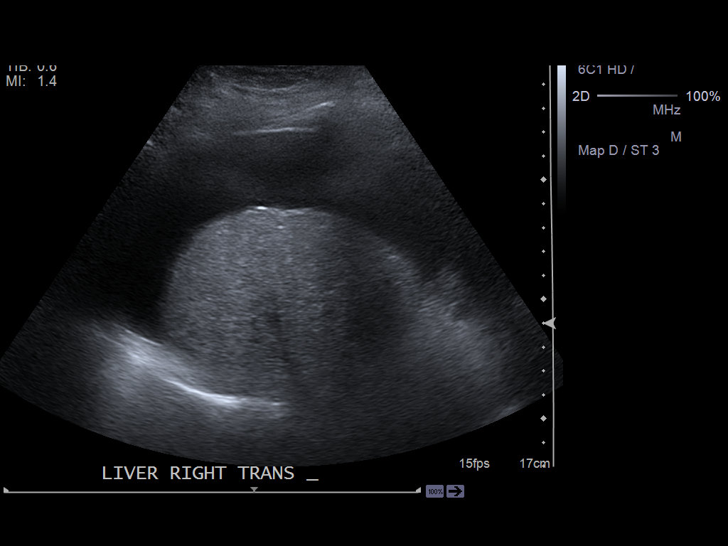
[im 26/76]
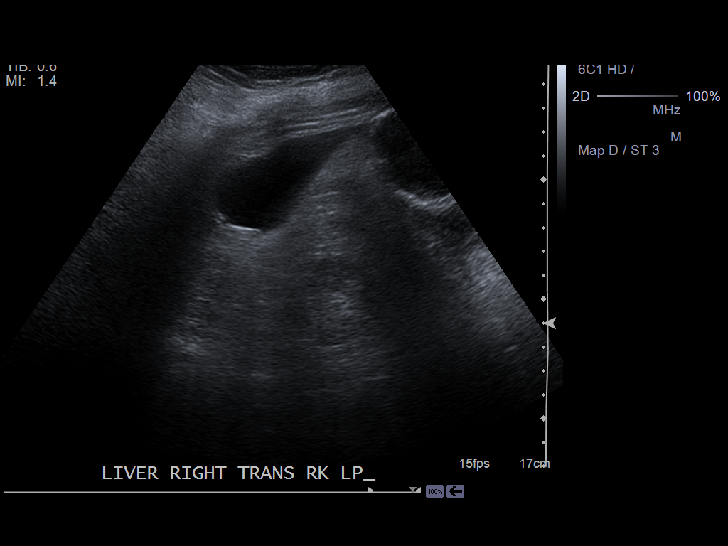
[im 32/76]
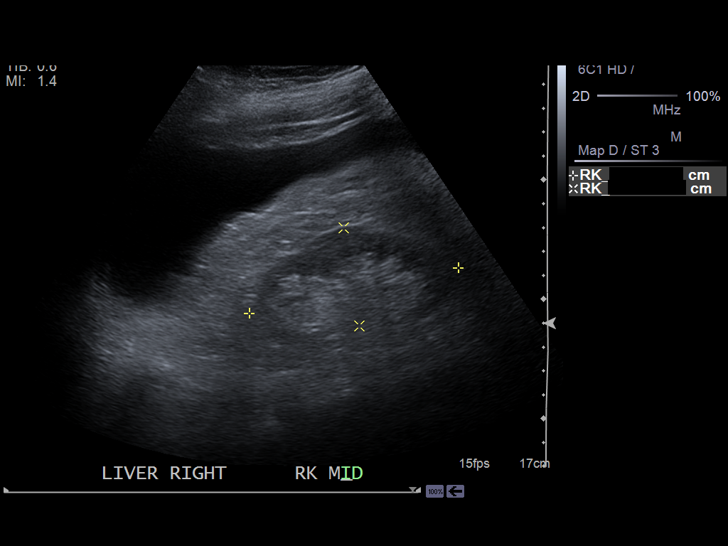
[im 38/76]
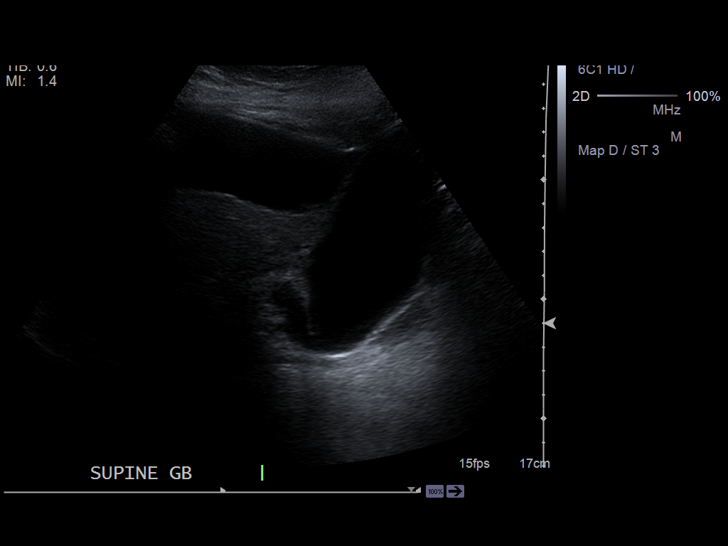
[im 44/76]
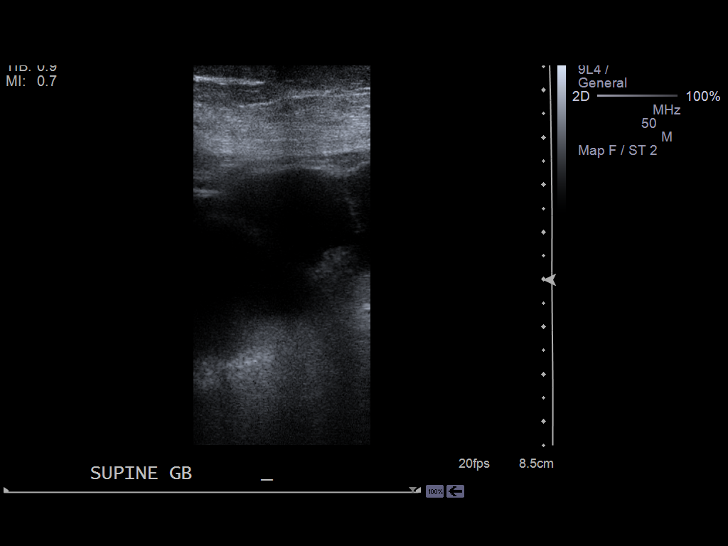
[im 51/76]
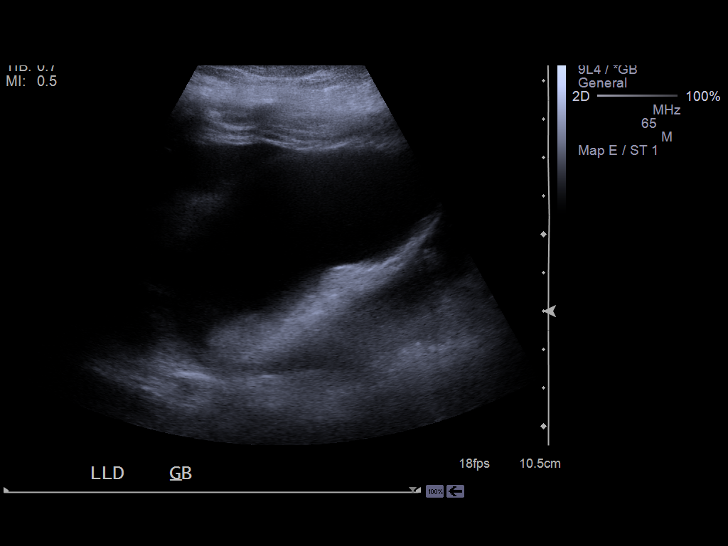
[im 57/76]
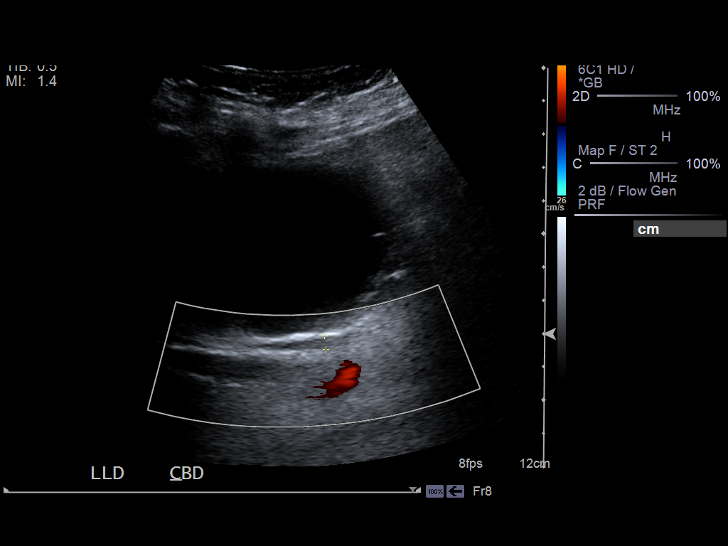
[im 63/76]
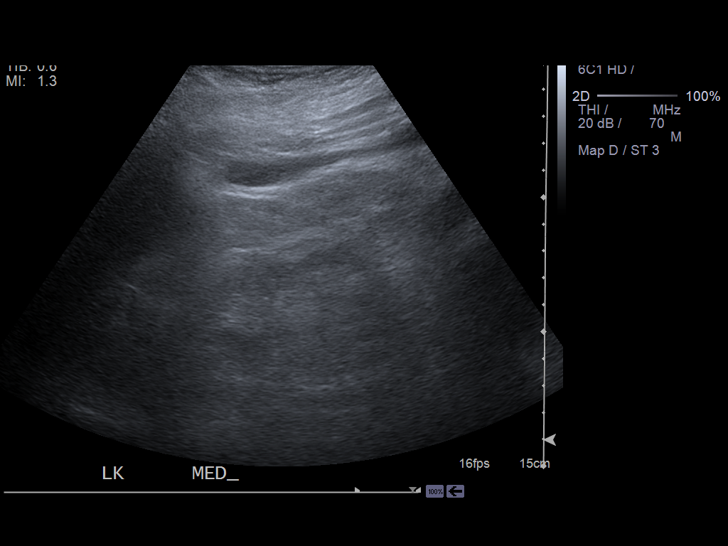
[im 69/76]
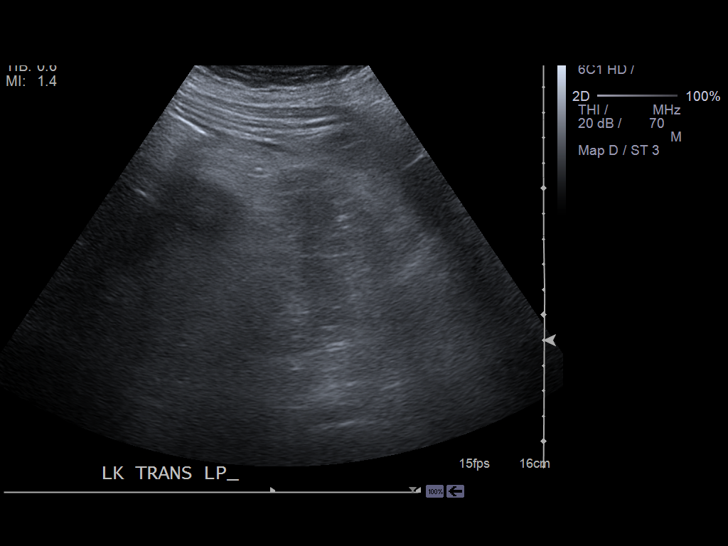
[im 76/76]
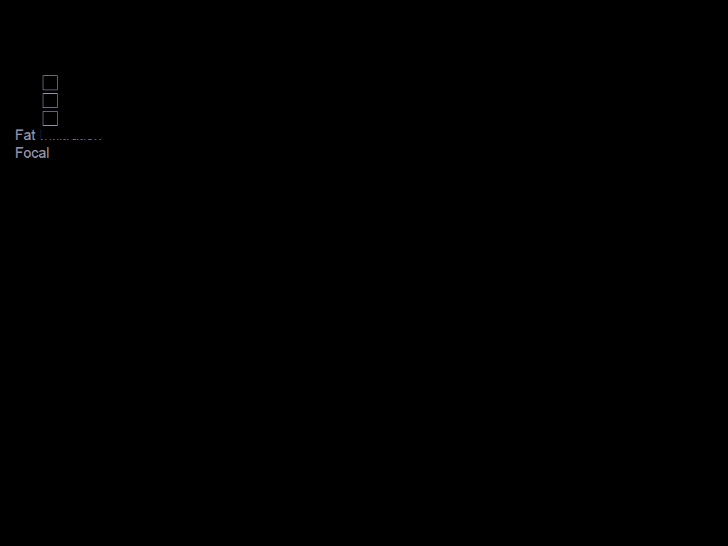

[13 of 25 positions shown; findings below may reference images not displayed]

PROCEDURE:     US  - US ABDOMEN GENERAL SURVEY  - March 27, 2012 [DATE]

RESULT:     The liver appears small and demonstrates an irregular surface
contour. No focal mass or ductal dilation is demonstrated. There is ascites
present. Portal venous flow is normal in direction toward the liver. Normal
flow is also demonstrated in the hepatic veins.

There are tiny stones within the gallbladder. There is mild gallbladder wall
thickening to 4 mm which may be related to the presence of ascites. There is
no positive sonographic Murphy's sign. The common bile duct is normal at
mm in diameter.

Bowel gas limited evaluation of the pancreas. The spleen is normal in size
at 8.5 cm in greatest dimension. The abdominal aorta, inferior vena cava,
and kidneys exhibit no acute abnormality. The kidneys demonstrate cortical
thinning bilaterally and the echotexture of the renal cortex appears
increased.
IMPRESSION: 1. The liver appears small and its surface is irregular suggesting
cirrhosis. There is considerable ascites present. There is no splenomegaly.
2. There are small stones and sludge present. There is no positive
sonographic Murphy's sign. Mild thickening of the gallbladder wall to 4 mm
is visible.
2. The pancreas was obscured by bowel gas.
3. The kidneys demonstrate atrophy with cortical thinning but no evidence of
obstruction.

Followup CT scanning may be a useful means to further assess the anatomy of
the pancreas as well as of the liver.

[REDACTED]

## 2013-09-14 IMAGING — CR DG CHEST 2V
1 series · 2 of 2 positions shown · non-contrast
Comparison: none

REASON FOR EXAM: weakness
COMMENTS:

PROCEDURE:     DXR - DXR CHEST PA (OR AP) AND LATERAL  - April 02, 2012 [DATE]
RESULT:     Poor inspiration. Lungs are clear. Heart size are normal.
Previously identified pleural effusion on 10/31/2011 has resolved.

[Series 1: x chest ap · 0.14mm/px · 2 of 2 slices shown]
[im 1/2]
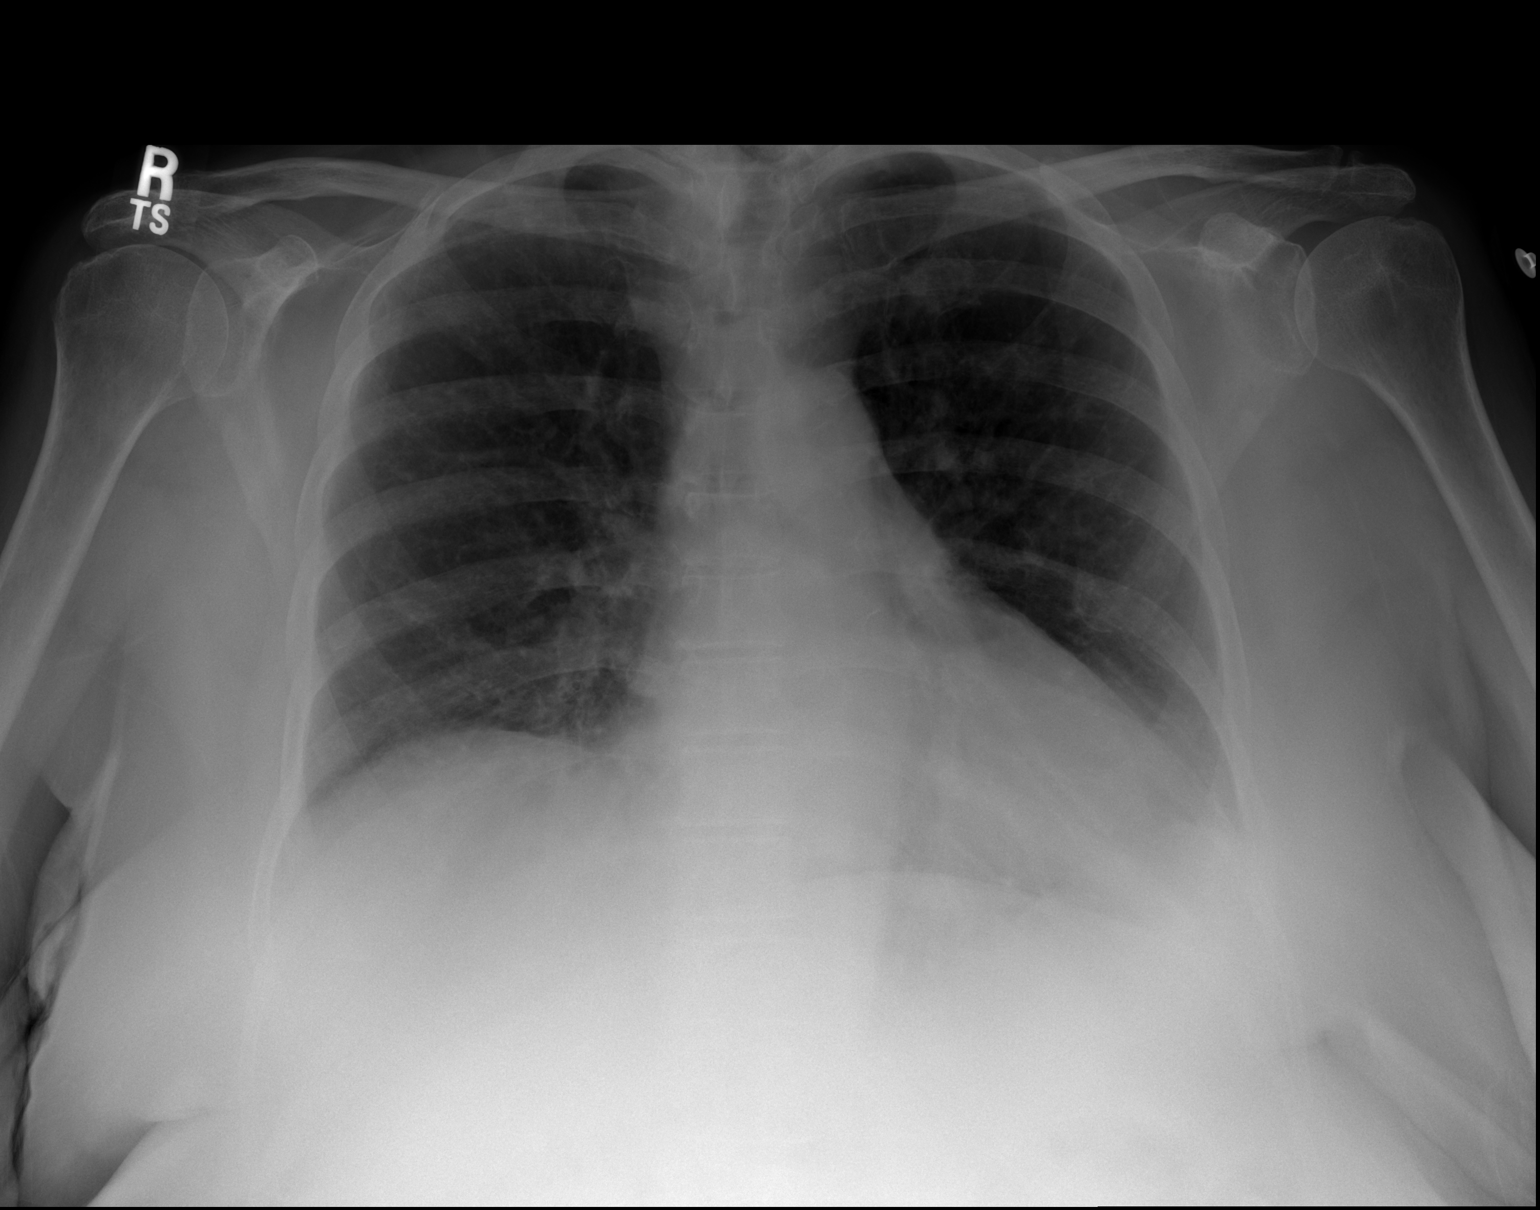
[im 2/2]
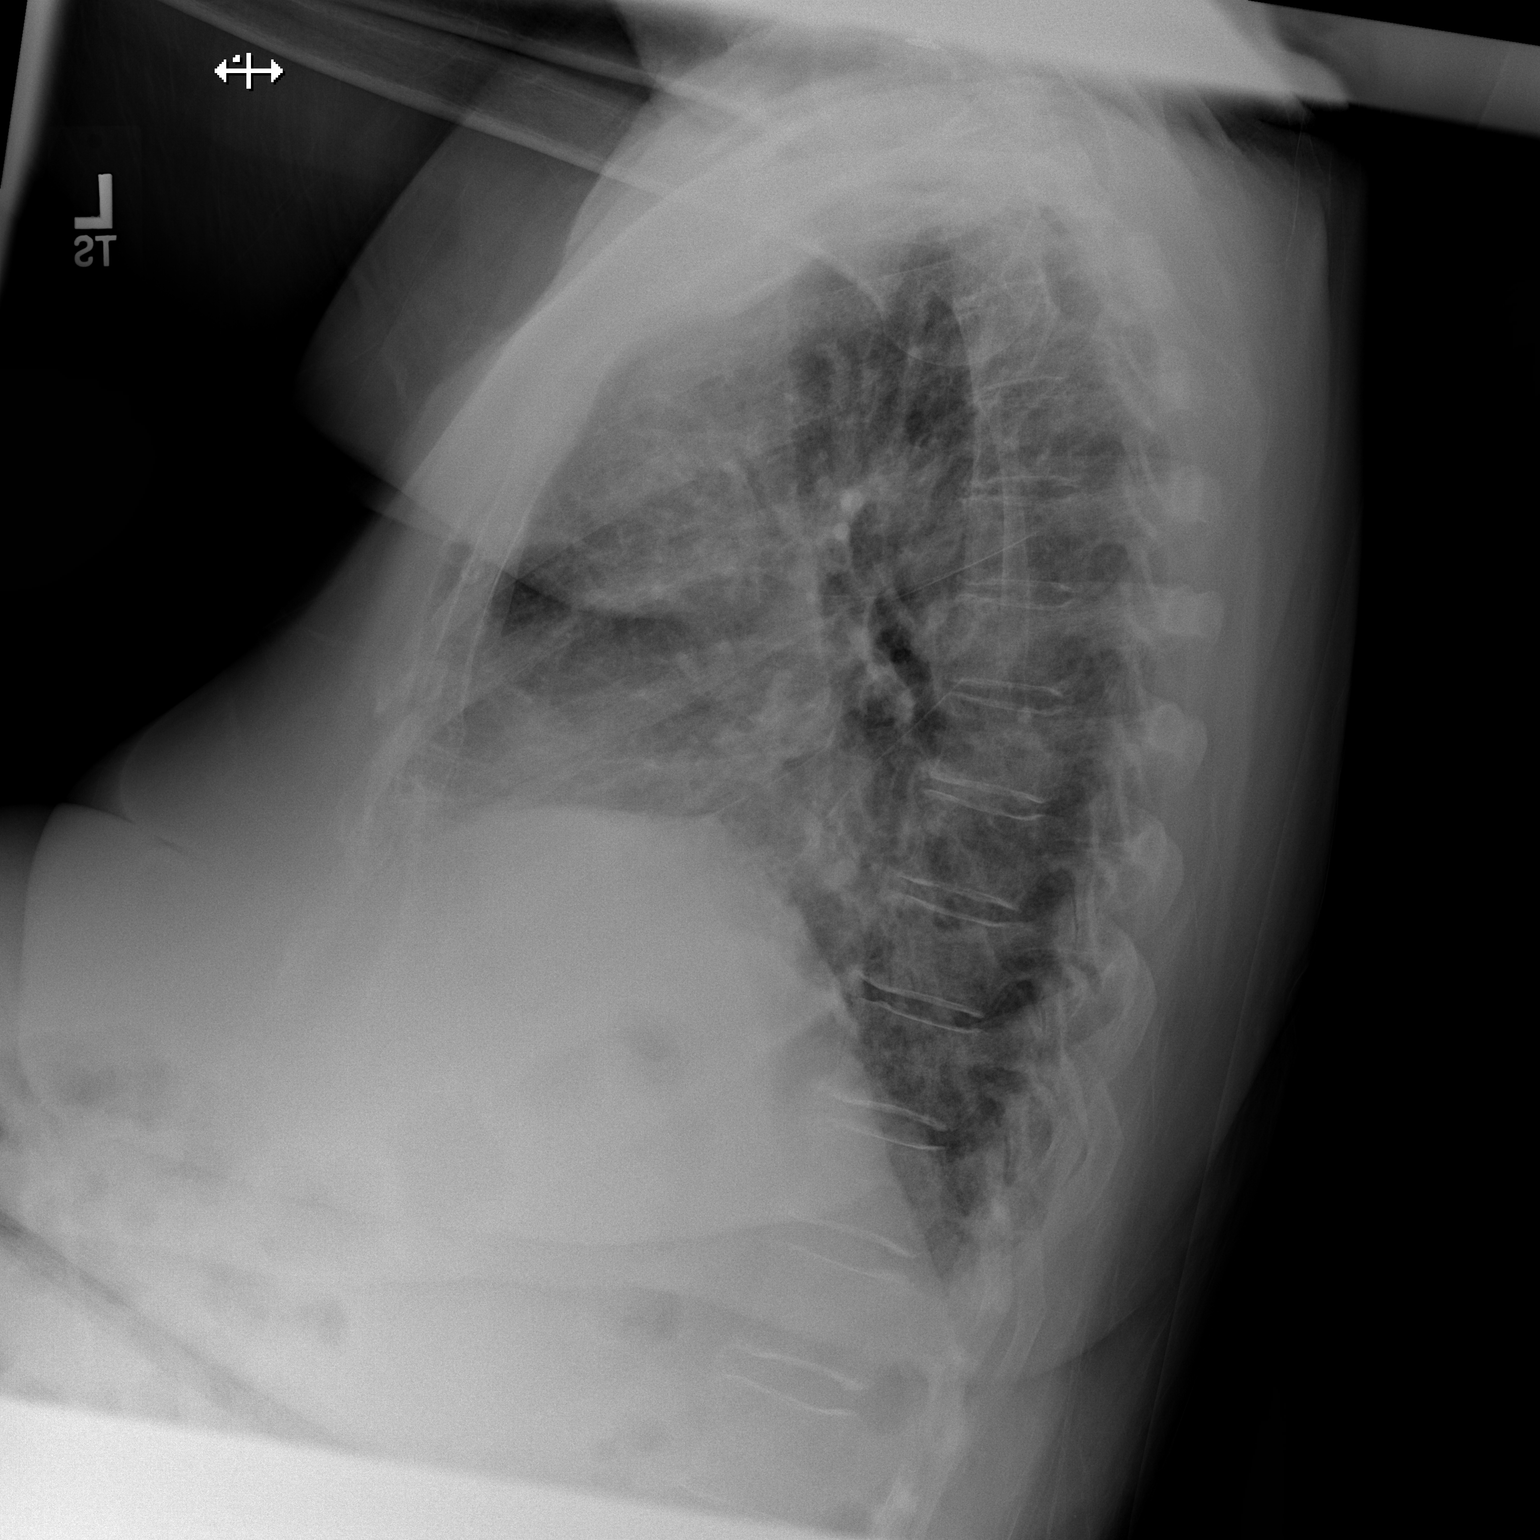

[2 of 2 positions shown; findings below may reference images not displayed]

IMPRESSION: No acute abnormality. Interim resolution of left pleural
effusion.

## 2014-06-13 NOTE — H&P (Signed)
PATIENT NAME:  Lindsey Trevino, Lindsey Trevino MR#:  [id number] DATE OF BIRTH:  01-02-1922  DATE OF ADMISSION:  04/02/2012  PRIMARY CARE PHYSICIAN: Einar Pheasant.   REFERRING PHYSICIAN: Dr. Lavonia Drafts.   CHIEF COMPLAINT: Increased lethargy, back pain and fever.   HISTORY OF THE PRESENT ILLNESS: The patient is an 81 year old, pleasant, Caucasian female with history of chronic kidney disease and liver cirrhosis. Was in her usual state of health until the last few days she noticed that she was getting generalized weakness and she was complaining also of low back pain. Her condition worsened to the extent that she started to use a walker in ambulation, while prior to that she was able to walk freely. Today, she became too weak to even get out of the bed. The patient was transported to the hospital for evaluation, and workup here at the Emergency Department is consistent with underlying urinary tract infection leading to acute pyelonephritis. The patient was admitted for further evaluation and treatment.   REVIEW OF SYSTEMS:   CONSTITUTIONAL: Reported fever, some chills and generalized fatigue.  EYES: No blurring of vision. No double vision.  EARS, NOSE, THROAT: No hearing deficit. No sore throat. No dysphagia.  CARDIOVASCULAR: No chest pain. No shortness of breath. No syncope.  RESPIRATORY: No cough. No sputum production. No chest pain.  GASTROINTESTINAL: No abdominal pain. No vomiting. No diarrhea.  GENITOURINARY: No dysuria or frequency but reports back pain.  MUSCULOSKELETAL: Apart from the back pain, she has no other joint pain or swelling. No muscular pain or swelling.  INTEGUMENTARY: No skin rash. No ulcers.  NEUROLOGY: No focal weakness. No seizure activity. No headache.  PSYCHIATRY: No anxiety. No depression.  ENDOCRINE: No polyuria or polydipsia. No heat or cold intolerance.   PAST MEDICAL HISTORY: Liver cirrhosis, likely primary biliary cirrhosis, ascites and there were plans to remove some fluid  from her she reports. Chronic kidney disease stage IV.   PAST SURGICAL HISTORY: Hysterectomy, appendectomy, C-section, cataract surgery.   FAMILY HISTORY: Strong family history for liver cirrhosis including her sister and 2 brothers. One of her sisters has diabetes mellitus as well.   SOCIAL HABITS: Nonsmoker. No history of alcohol or drug abuse.   SOCIAL HISTORY: She is a retired Equities trader. She is a widow. Lives at home, cared for by her son.   ADMISSION MEDICATIONS: Lactulose 30 mL twice a day, Biotin 1000 mcg once a day, metoprolol extended release 50 mg once a day, omeprazole 20 mg once a day.   ALLERGIES: SULFA.   PHYSICAL EXAMINATION:  VITAL SIGNS: Blood pressure 146/63, respiratory rate 20, pulse 70, temperature 97.8, oxygen saturation 96%.  GENERAL APPEARANCE: Elderly frail lady, lying in bed in semi-sitting position, in no acute distress.  HEAD AND NECK: Mild pallor. No icterus. No cyanosis. Ear examination revealed normal hearing, no discharge, no lesions. Nasal mucosa was normal. No bleeding, no ulcers, no discharge. Oropharyngeal area was normal without oral thrush, no ulcers. Eye examination revealed normal eyelids and conjunctivae. Pupils were about 6 to 7 mm, round, slightly irregular, sluggishly reactive to light. Neck is supple. Trachea at midline. No thyromegaly. No cervical masses.  HEART: Normal S1, S2. No S3, S4. No murmur. No gallop. No carotid bruits.  RESPIRATORY: Normal breathing pattern without use of accessory muscles. No rales. No wheezing.  ABDOMEN: Slightly distended. Soft without tenderness. No hepatosplenomegaly. No masses. No hernias.  BACK: She has CVA tenderness on the back and midline as well.  MUSCULOSKELETAL: No joint swelling. No clubbing.  SKIN: No ulcers. No subcutaneous nodules. There are scattered small telangiectasias.  NEUROLOGIC: Cranial nerves II through XII were intact. No focal motor deficit. Negative flapping tremor.  PSYCHIATRIC: The  patient is alert and oriented x3. Mood and affect were normal.   LABORATORY FINDINGS AND RADIOLOGIC DATA: Chest x-ray showed no acute cardiopulmonary abnormality. Heart size is enlarged. No effusion. EKG showed normal sinus rhythm, right bundle branch block, poor progression of R waves in the anterior chest leads. Serum glucose 95, BUN 33, creatinine 2.7. Her creatinine in September 2013 was 2.05. BUN at that time was 31. Serum sodium 140, potassium 5.6, bicarbonate 21. Anion gap was normal at 10. Estimated GFR is 15. In September of last year, GFR was 21. Her calcium 9.6, albumin 2.4, bilirubin 4.2. Her bilirubin in September 2013 was 2.7. Alkaline phosphatase 116, AST 112, ALT 57. CBC showed white count of 7800, hemoglobin 11.6, hematocrit 34, platelet count 133. MCV is elevated at 104. Also, MCH was 35, MCHC 34. Urinalysis showed cloudy urine, +3 bacteria and there are 213 white blood cells, 5 RBCs; however, there were also 35 epithelial cells as well.   ASSESSMENT:  1. Acute pyelonephritis.  2. Chronic kidney disease stage IV which appears a little worse than last year.  3. Liver cirrhosis, most likely secondary to primary biliary cirrhosis.  4. Hyperbilirubinemia secondary to her liver cirrhosis.  5. Hyperkalemia.  6. Ascites.  7. Macrocytic anemia.   PLAN: Will admit the patient to the medical floor. Urine culture was sent. IV antibiotic using Rocephin started. One dose of Kayexalate 15 g. Also, I will give a small dose of intravenous Lasix 20 mg and monitor the potassium. Will repeat basic metabolic profile tomorrow. For her back pain, I will give her hydrocodone with acetaminophen p.r.n. Continue lactulose and metoprolol. Will consult physical therapy to start assessing her physical strength and the ability to restore ambulation. For deep vein thrombosis prophylaxis, will place TED stockings. The prothrombin time was not checked but I expect to be slightly elevated. I spoke with the patient and  her son regarding a Living Will. They are both unsure if she has one or not, but her code status is FULL CODE unless she has irreversible condition; then, she does not want to be on life support for a long time.   Time spent in evaluating this patient took more than 55 minutes.    ____________________________ Clovis Pu. Lenore Manner, MD amd:gb D: 04/02/2012 23:55:58 ET T: 04/03/2012 00:58:00 ET JOB#: [id number]  cc: Clovis Pu. Lenore Manner, MD, <Dictator> Ellin Saba MD ELECTRONICALLY SIGNED 04/03/2012 22:46

## 2014-06-13 NOTE — Discharge Summary (Signed)
DATE OF BIRTH:  Jul 31, 1921  The patient was admitted to the hospital on 02/10, discharge 04/05/2012.  ADMITTING DIAGNOSIS: Acute pyelonephritis.   DISCHARGE DIAGNOSES:  1.  Acute pyelonephritis, with negative urine cultures. 2.  Nausea, vomiting.  3.  Generalized weakness.  4.  Acute on chronic renal failure.  5.  Hyperkalemia due to renal failure. 6.  Back pain. 7.  History of liver cirrhosis with hepatic encephalopathy.  8.  Ascites.  9.  Chronic kidney disease stage IV.   DISCHARGE CONDITION: Stable.   DISCHARGE MEDICATIONS: The patient is to resume her outpatient medications, which are:  1.  Metoprolol 50 mg p.o. once daily. 2.  Biotin 1000 mcg p.o. daily. 3.  Omeprazole 20 mg p.o. daily. 4.  Lactulose  30 mL once daily as needed. 5.  Keflex 250 mg p.o. 3 times daily.  6.  Acetaminophen/hydrocodone 325/5 mg, 1 tablet every 4 hours as needed.   Home oxygen: None.   DIET: 2 grams salt, low fat, low cholesterol.   ACTIVITY LIMITATIONS: As tolerated. Referral to home health, physical therapy R.N. as well as aide.   FOLLOW-UP APPOINTMENT:  With Dr. Einar Pheasant in 2 days after discharge.   CONSULTANTS:   Care Management.  RADIOLOGIC STUDIES: Chest PA and lateral 04/02/2012 revealed no acute abnormality. Resolution of left pleural effusion was noted.   The patient is an 81 year old female with past medical history significant for history of liver cirrhosis, ascites, CKD stage IV, who presented to the hospital with increasing lethargy, back pain as well as fevers. Please refer to Dr. Zacarias Pontes admission on 04/02/2012. On arrival to the hospital, the patient's vital signs:  Blood pressure 146/63, respiration rate was 20, pulse was 70, temperature was 97.8, oxygen saturation was 96% on oxygen therapy. Physical exam revealed moderate distended abdomen; however, soft, with no significant tenderness. No masses. No hernias were noted. She had severe tenderness in the back and  midline as well.   LAB DATA:  Done on the day of admission, 04/02/2012, showed elevated BUN and creatinine  33 and 2.78, potassium was elevated at 5.6. The patient's bicarbonate level was 21. Estimated GFR for African-American would be 17, and non-African-American would be 15. Otherwise, lipase level was normal at 115. Liver enzymes revealed albumin level of 2.6, total bilirubin of 4.2, and AST 112. White blood cell count was normal at 7.8, hemoglobin was 11.6, platelet count 133, with MCV at 104. Urine cultures did show mixed bacterial organism, results suggestive of contamination. Urinalysis was remarkable for amber cloudy urine, negative for glucose, bilirubin or ketones, specific gravity was 1.025, pH was 5.0. Negative for blood, protein, nitrites, 2+ leukocyte esterase, 5 red blood cells, 213 white blood cells were noted, 3+ bacteria, as well as 35 epithelial cells were noted. (Dictation Anomaly)  as well as mucus was present as well as hyaline cast.   The patient was admitted to the hospital. She was started on antibiotic therapy for suspected pyelonephritis, and she was given also IV fluids. With this therapy, she somewhat improved, but still complained of some back pains, admitting that her back pain is nothing new.  She overall improved as time progressed. She was advised to continue antibiotic therapy with Keflex for the next few days to complete course. She had generalized weakness, also somewhat resolved with antibiotic therapy as well as physical therapy. The physical therapist recommended home health physical therapy, which was prescribed for her upon discharge.   The patient's hyperkalemia resolved with IV fluid  administration and improvement of her kidney function. By 04/06/2011, patient's BUN and creatinine were stable, and her potassium level was 3.9. The patient was noted to have high ammonia level at 76. She was advised to continue lactulose as she was previously prescribed. It was felt  that patient's acute on chronic renal failure could have been related to significant diarrhea which was caused by lactulose. The patient was advised to use Lactulose only enough for her to have at least 1 or 2 bowel movements a day, but  not a diarrheal stool. If she does develop diarrheal stools, she was advised to drink plenty of fluids to counteract the dehydration effect. For history of liver cirrhosis and history of hepatic encephalopathy, patient is to continue her outpatient management. The patient is being discharged in stable condition with the above-mentioned medications and followup. Her vital signs on the day of discharge: Temperature 97.5, pulse was 80, respiratory rate was 20, blood pressure 143/76, saturation was 98% on room air at rest.   TIME SPENT: 40 minutes.      ____________________________ Theodoro Grist, MD rv:mr D: 04/08/2012 11:32:00 ET T: 04/08/2012 23:19:25 ET JOB#: [id number]  cc: Einar Pheasant, MD Theodoro Grist, MD, <Dictator>      Darcelle Herrada MD ELECTRONICALLY SIGNED 04/15/2012 18:22
